# Patient Record
Sex: Female | Born: 1935 | Race: White | Hispanic: No | Smoking: Never smoker
Health system: Southern US, Community
[De-identification: ages and names within clinical notes are randomized; demographics above are authoritative.]

## PROBLEM LIST (undated history)

## (undated) DIAGNOSIS — K219 Gastro-esophageal reflux disease without esophagitis: Secondary | ICD-10-CM

## (undated) DIAGNOSIS — R269 Unspecified abnormalities of gait and mobility: Secondary | ICD-10-CM

## (undated) DIAGNOSIS — G4723 Circadian rhythm sleep disorder, irregular sleep wake type: Secondary | ICD-10-CM

## (undated) DIAGNOSIS — R42 Dizziness and giddiness: Secondary | ICD-10-CM

## (undated) DIAGNOSIS — G609 Hereditary and idiopathic neuropathy, unspecified: Secondary | ICD-10-CM

## (undated) DIAGNOSIS — E538 Deficiency of other specified B group vitamins: Secondary | ICD-10-CM

## (undated) DIAGNOSIS — E039 Hypothyroidism, unspecified: Secondary | ICD-10-CM

## (undated) DIAGNOSIS — F32A Depression, unspecified: Secondary | ICD-10-CM

## (undated) DIAGNOSIS — F518 Other sleep disorders not due to a substance or known physiological condition: Secondary | ICD-10-CM

## (undated) DIAGNOSIS — M810 Age-related osteoporosis without current pathological fracture: Secondary | ICD-10-CM

## (undated) DIAGNOSIS — M1990 Unspecified osteoarthritis, unspecified site: Secondary | ICD-10-CM

## (undated) DIAGNOSIS — F329 Major depressive disorder, single episode, unspecified: Secondary | ICD-10-CM

## (undated) HISTORY — PX: CHOLECYSTECTOMY: SHX55

## (undated) HISTORY — DX: Hypothyroidism, unspecified: E03.9

## (undated) HISTORY — DX: Dizziness and giddiness: R42

## (undated) HISTORY — DX: Hereditary and idiopathic neuropathy, unspecified: G60.9

## (undated) HISTORY — PX: OTHER SURGICAL HISTORY: SHX169

## (undated) HISTORY — DX: Deficiency of other specified B group vitamins: E53.8

## (undated) HISTORY — DX: Gastro-esophageal reflux disease without esophagitis: K21.9

## (undated) HISTORY — DX: Other sleep disorders not due to a substance or known physiological condition: F51.8

## (undated) HISTORY — DX: Unspecified abnormalities of gait and mobility: R26.9

## (undated) HISTORY — DX: Depression, unspecified: F32.A

## (undated) HISTORY — DX: Major depressive disorder, single episode, unspecified: F32.9

## (undated) HISTORY — PX: COSMETIC SURGERY: SHX468

## (undated) HISTORY — PX: ORIF NONUNION HUMERUS FRACTURE: SUR945

## (undated) HISTORY — DX: Circadian rhythm sleep disorder, irregular sleep wake type: G47.23

## (undated) HISTORY — DX: Unspecified osteoarthritis, unspecified site: M19.90

## (undated) HISTORY — DX: Age-related osteoporosis without current pathological fracture: M81.0

---

## 2000-04-18 ENCOUNTER — Encounter: Payer: Self-pay | Admitting: *Deleted

## 2000-04-18 ENCOUNTER — Emergency Department (HOSPITAL_COMMUNITY): Admission: EM | Admit: 2000-04-18 | Discharge: 2000-04-18 | Payer: Self-pay | Admitting: *Deleted

## 2000-04-20 ENCOUNTER — Inpatient Hospital Stay (HOSPITAL_COMMUNITY): Admission: EM | Admit: 2000-04-20 | Discharge: 2000-04-23 | Payer: Self-pay | Admitting: Internal Medicine

## 2000-04-20 ENCOUNTER — Encounter (INDEPENDENT_AMBULATORY_CARE_PROVIDER_SITE_OTHER): Payer: Self-pay | Admitting: Specialist

## 2000-04-21 ENCOUNTER — Encounter: Payer: Self-pay | Admitting: Gastroenterology

## 2000-04-22 ENCOUNTER — Encounter: Payer: Self-pay | Admitting: General Surgery

## 2003-05-19 ENCOUNTER — Other Ambulatory Visit: Admission: RE | Admit: 2003-05-19 | Discharge: 2003-05-19 | Payer: Self-pay | Admitting: Obstetrics and Gynecology

## 2003-08-09 ENCOUNTER — Ambulatory Visit (HOSPITAL_COMMUNITY): Admission: RE | Admit: 2003-08-09 | Discharge: 2003-08-09 | Payer: Self-pay | Admitting: Cardiology

## 2003-10-18 ENCOUNTER — Encounter: Admission: RE | Admit: 2003-10-18 | Discharge: 2003-10-18 | Payer: Self-pay | Admitting: Internal Medicine

## 2004-05-28 ENCOUNTER — Encounter: Admission: RE | Admit: 2004-05-28 | Discharge: 2004-05-28 | Payer: Self-pay | Admitting: Internal Medicine

## 2004-09-24 ENCOUNTER — Emergency Department (HOSPITAL_COMMUNITY): Admission: EM | Admit: 2004-09-24 | Discharge: 2004-09-24 | Payer: Self-pay | Admitting: Emergency Medicine

## 2004-10-07 ENCOUNTER — Ambulatory Visit (HOSPITAL_COMMUNITY): Admission: RE | Admit: 2004-10-07 | Discharge: 2004-10-07 | Payer: Self-pay | Admitting: Internal Medicine

## 2005-04-03 ENCOUNTER — Emergency Department (HOSPITAL_COMMUNITY): Admission: EM | Admit: 2005-04-03 | Discharge: 2005-04-04 | Payer: Self-pay | Admitting: Emergency Medicine

## 2005-04-09 ENCOUNTER — Encounter: Admission: RE | Admit: 2005-04-09 | Discharge: 2005-04-30 | Payer: Self-pay | Admitting: Internal Medicine

## 2005-05-23 ENCOUNTER — Encounter: Admission: RE | Admit: 2005-05-23 | Discharge: 2005-08-27 | Payer: Self-pay | Admitting: Internal Medicine

## 2006-12-01 HISTORY — PX: ALLOGRAFT APPLICATION: SHX6404

## 2007-06-29 ENCOUNTER — Emergency Department (HOSPITAL_COMMUNITY): Admission: EM | Admit: 2007-06-29 | Discharge: 2007-06-29 | Payer: Self-pay | Admitting: Emergency Medicine

## 2007-07-19 ENCOUNTER — Ambulatory Visit (HOSPITAL_COMMUNITY): Admission: RE | Admit: 2007-07-19 | Discharge: 2007-07-19 | Payer: Self-pay | Admitting: Internal Medicine

## 2007-07-22 ENCOUNTER — Ambulatory Visit (HOSPITAL_COMMUNITY): Admission: RE | Admit: 2007-07-22 | Discharge: 2007-07-22 | Payer: Self-pay | Admitting: Interventional Radiology

## 2007-08-05 ENCOUNTER — Encounter: Payer: Self-pay | Admitting: Interventional Radiology

## 2007-08-20 ENCOUNTER — Encounter: Payer: Self-pay | Admitting: Interventional Radiology

## 2007-08-23 ENCOUNTER — Encounter: Admission: RE | Admit: 2007-08-23 | Discharge: 2007-08-23 | Payer: Self-pay | Admitting: Interventional Radiology

## 2007-09-13 ENCOUNTER — Inpatient Hospital Stay (HOSPITAL_COMMUNITY): Admission: RE | Admit: 2007-09-13 | Discharge: 2007-09-16 | Payer: Self-pay | Admitting: Orthopedic Surgery

## 2007-09-14 ENCOUNTER — Ambulatory Visit: Payer: Self-pay | Admitting: Physical Medicine & Rehabilitation

## 2007-10-06 ENCOUNTER — Ambulatory Visit: Payer: Self-pay | Admitting: Cardiology

## 2007-10-06 ENCOUNTER — Inpatient Hospital Stay (HOSPITAL_COMMUNITY): Admission: EM | Admit: 2007-10-06 | Discharge: 2007-10-09 | Payer: Self-pay | Admitting: Emergency Medicine

## 2007-10-08 ENCOUNTER — Ambulatory Visit: Payer: Self-pay | Admitting: Vascular Surgery

## 2007-10-08 ENCOUNTER — Encounter (INDEPENDENT_AMBULATORY_CARE_PROVIDER_SITE_OTHER): Payer: Self-pay | Admitting: *Deleted

## 2007-10-26 ENCOUNTER — Ambulatory Visit (HOSPITAL_COMMUNITY): Admission: RE | Admit: 2007-10-26 | Discharge: 2007-10-26 | Payer: Self-pay | Admitting: Neurology

## 2008-08-11 ENCOUNTER — Encounter: Admission: RE | Admit: 2008-08-11 | Discharge: 2008-08-11 | Payer: Self-pay | Admitting: Neurology

## 2009-05-13 ENCOUNTER — Emergency Department (HOSPITAL_COMMUNITY): Admission: EM | Admit: 2009-05-13 | Discharge: 2009-05-13 | Payer: Self-pay | Admitting: Emergency Medicine

## 2009-12-07 ENCOUNTER — Encounter: Admission: RE | Admit: 2009-12-07 | Discharge: 2009-12-07 | Payer: Self-pay | Admitting: Internal Medicine

## 2009-12-17 ENCOUNTER — Emergency Department (HOSPITAL_COMMUNITY): Admission: EM | Admit: 2009-12-17 | Discharge: 2009-12-18 | Payer: Self-pay | Admitting: Emergency Medicine

## 2009-12-20 ENCOUNTER — Encounter: Admission: RE | Admit: 2009-12-20 | Discharge: 2009-12-20 | Payer: Self-pay | Admitting: Internal Medicine

## 2010-04-07 ENCOUNTER — Ambulatory Visit: Payer: Self-pay | Admitting: Diagnostic Radiology

## 2010-04-07 ENCOUNTER — Emergency Department (HOSPITAL_BASED_OUTPATIENT_CLINIC_OR_DEPARTMENT_OTHER): Admission: EM | Admit: 2010-04-07 | Discharge: 2010-04-07 | Payer: Self-pay | Admitting: Emergency Medicine

## 2010-12-22 ENCOUNTER — Encounter: Payer: Self-pay | Admitting: Internal Medicine

## 2011-02-16 LAB — POCT CARDIAC MARKERS
CKMB, poc: 1.6 ng/mL (ref 1.0–8.0)
Myoglobin, poc: 62.2 ng/mL (ref 12–200)

## 2011-02-16 LAB — DIFFERENTIAL
Basophils Absolute: 0 10*3/uL (ref 0.0–0.1)
Basophils Relative: 0 % (ref 0–1)
Lymphocytes Relative: 7 % — ABNORMAL LOW (ref 12–46)
Neutro Abs: 9.5 10*3/uL — ABNORMAL HIGH (ref 1.7–7.7)
Neutrophils Relative %: 89 % — ABNORMAL HIGH (ref 43–77)

## 2011-02-16 LAB — CBC
Hemoglobin: 14.7 g/dL (ref 12.0–15.0)
MCHC: 33.5 g/dL (ref 30.0–36.0)
MCV: 97.3 fL (ref 78.0–100.0)
RBC: 4.5 MIL/uL (ref 3.87–5.11)
RDW: 13.5 % (ref 11.5–15.5)

## 2011-02-16 LAB — URINE CULTURE

## 2011-02-16 LAB — URINALYSIS, ROUTINE W REFLEX MICROSCOPIC
Hgb urine dipstick: NEGATIVE
Ketones, ur: 40 mg/dL — AB
Leukocytes, UA: NEGATIVE
Nitrite: NEGATIVE
Urobilinogen, UA: 1 mg/dL (ref 0.0–1.0)

## 2011-02-16 LAB — SEDIMENTATION RATE: Sed Rate: 6 mm/hr (ref 0–22)

## 2011-02-16 LAB — URINE MICROSCOPIC-ADD ON

## 2011-02-18 LAB — CBC
Hemoglobin: 14.7 g/dL (ref 12.0–15.0)
MCHC: 34 g/dL (ref 30.0–36.0)
RBC: 4.61 MIL/uL (ref 3.87–5.11)
WBC: 8.9 10*3/uL (ref 4.0–10.5)

## 2011-02-18 LAB — URINALYSIS, ROUTINE W REFLEX MICROSCOPIC
Bilirubin Urine: NEGATIVE
Glucose, UA: NEGATIVE mg/dL
Hgb urine dipstick: NEGATIVE
Specific Gravity, Urine: 1.018 (ref 1.005–1.030)
pH: 6.5 (ref 5.0–8.0)

## 2011-02-18 LAB — URINE MICROSCOPIC-ADD ON

## 2011-02-18 LAB — BASIC METABOLIC PANEL
CO2: 28 mEq/L (ref 19–32)
Calcium: 9.7 mg/dL (ref 8.4–10.5)
Chloride: 104 mEq/L (ref 96–112)
Creatinine, Ser: 0.8 mg/dL (ref 0.4–1.2)
GFR calc Af Amer: >60 mL/min (ref >60)
Sodium: 143 mEq/L (ref 135–145)

## 2011-02-18 LAB — DIFFERENTIAL
Lymphs Abs: 0.9 10*3/uL (ref 0.7–4.0)
Monocytes Absolute: 0.6 10*3/uL (ref 0.1–1.0)
Monocytes Relative: 7 % (ref 3–12)
Neutro Abs: 7.3 10*3/uL (ref 1.7–7.7)
Neutrophils Relative %: 82 % — ABNORMAL HIGH (ref 43–77)

## 2011-02-18 LAB — POCT CARDIAC MARKERS: Troponin i, poc: <0.05 ng/mL (ref 0.00–0.09)

## 2011-04-15 NOTE — Discharge Summary (Signed)
NAMEAFNAN, CADIENTE NO.:  [id number]   MEDICAL RECORD NO.:  [id number]          PATIENT TYPE:  INP   LOCATION:  3023                         FACILITY:  [facility]   PHYSICIAN:  Doralee Albino. Carola Frost, M.D. DATE OF BIRTH:  [birth year]   DATE OF ADMISSION:  09/13/2007  DATE OF DISCHARGE:  09/15/2007                               DISCHARGE SUMMARY   DISCHARGE DIAGNOSIS:  Left proximal humerus nonunion.   ADDITIONAL DISCHARGE DIAGNOSES:  1. Hypothyroidism.  2. Gastroesophageal reflux disease.  3. Multiple falls.  4. Questioned arthritis.  5. Degenerative disk disease .  6. Facial injuries.  7. Prior left humerus fracture, which was sustained on May 31, 2007.   PROCEDURES PERFORMED:  September 13, 2007 -- repair of left humerus  nonunion.   BRIEF SUMMARY OF HOSPITAL COURSE:  Ellen Hughes underwent the procedure  described above without complication.  Postoperatively she did well with  pendulum range of motion of the shoulder, and active range of motion of  the elbow, wrist and hand with the assistance of occupational therapy.  She mobilized with physical therapy, given some vertigo and imbalance  issues.  She did require 2 units of packed red blood cells for acute  blood loss anemia.  She responded quite well to that and was able to  resume a regular diet and bowel function.  Her wound was noted to be  clean, dry and intact on postoperative day #3; and at that time she was  deemed stable for discharge.   DISCHARGE INSTRUCTIONS:  Ms. Latorre is to continue to mobilize with  physical therapy, with help given her history of imbalance and vertigo.  She may [place] a cane on the contralateral extremity for assistance with  balance.  She will continue to work with occupational therapy, or  occupational therapy is not available, for active-passive range of  motion of the shoulder, wrist and hand; as well as pivotal motions of  the shoulder.  She will be in a sling for comfort.  Dressings  should be  changed daily until there is no drainage.  After there has been no  drainage for 48 hours she may shower.   I will plan to follow up with her in 2 weeks, at which time will remove  her staples and likely initiate more aggressive-passive range of motion;  with active range of motion of the shoulder beginning around 6-8 weeks.   DISCHARGE MEDICATIONS:  1. Percocet 5/325 mg 1-2 tablets q.4-6 h. as needed for pain control.  2. She may resume a myriad of over-the-counter medications, these      include:  fish oil daily, lutein 20 mg q.a.m., biotin 1000 mcg      q.a.m., folic acid 800 mcg daily, platinum C daily, Miralax daily,      OsCal.  3. Mobic should be held.  She can take:  1. Lexapro 20 mg p.o. daily,  2. Synthroid 100 mcg p.o. daily.  3. Prilosec 20 mg p.o. daily.  4. Additional over-the-counter stool softeners as needed.  5. Robaxin 500 mg p.o. q.6 h. p.r.n.  6. Ambien as needed daily.  7. Phenergan for nausea, which she has used in the past.      Doralee Albino. Carola Frost, M.D.  Electronically Signed     MHH/MEDQ  D:  09/15/2007  T:  09/16/2007  Job:  [id number]

## 2011-04-15 NOTE — Op Note (Signed)
NAMEAYSE, MCCARTIN NO.:  [id number]   MEDICAL RECORD NO.:  [id number]          PATIENT TYPE:  INP   LOCATION:  3023                         FACILITY:  [facility]   PHYSICIAN:  Doralee Albino. Carola Frost, M.D. DATE OF BIRTH:  1921-02-15   DATE OF PROCEDURE:  09/13/2007  DATE OF DISCHARGE:  09/16/2007                               OPERATIVE REPORT   PREOPERATIVE DIAGNOSIS:  Left proximal humerus pseudoarthrosis.   POSTOPERATIVE DIAGNOSIS:  Left proximal humerus pseudoarthrosis.   PROCEDURE:  Repair of left humerus nonunion using plating and OP1  allograft.   SURGEON:  Myrene Galas, M.D.   ASSISTANT:  R.N.   ANESTHESIA:  General.   COMPLICATIONS:  None.   SPECIMENS:  None.   DRAINS:  None.   DISPOSITION:  PACU, condition stable.   BRIEF SUMMARY OF INDICATIONS FOR PROCEDURE:  Skarlette Lattner is an 77-year-  old right-hand dominant female who has had a left comminuted proximal  humerus and humeral shaft fracture treated non operatively by Dr.  Jerl Santos for the last few months.  These have gone on to develop a  pseudoarthrosis at the distal site with what appears to be union  proximally.  We discussed the risks and benefits of surgery including  the possibility of infection, nerve injury, vessel injury, failure of  union, hardware failure, decreased range of motion, radial nerve injury,  specifically as well as others including heart attack, stroke and PE,  DVT, after that discussion, the patient and family wish to proceed.   DESCRIPTION OF PROCEDURE:  Ms. Gulledge was taken to the operating room  where general anesthesia was induced.  She was positioned supine on a  radiolucent table.  The left upper extremity prepped, draped usual  sterile fashion.  No tourniquet was used.  A standard anterolateral  approach to the humeral shaft was made extending proximally toward the  deltopectoral interval.  We retracted the biceps medially identified the  brachia split in line with  the fracture site and passed into copious  clear synovial type fluid consistent with pseudoarthrosis.  This area  was drained, the fracture identified by meticulous dissection.  We were  very careful watch for the radial nerve.  Again there was a gapped  malunion/union of the proximal fracture that extended into the surgical  neck and was continuous with a long lateral spike.  Using this spike, we  corrected for appropriate rotation and length and then placed two lag  screws.  Later one of those lag screws would be inadvertently driven  further into the bone by a locked screw placed through the plate near it  but these screws initially held the reduction while we checked  provisional orthogonal C-arm images confirming alignment and placement.  We then fashioned a long 35 locked plate along the tension side of the  bone and passed standard screws first to obtain compression and adequate  span of the bone and then this was followed by a series of locked  screws, one of which again passed close to the lag screw and driving it  through the bone.  We were unable to withdraw it without removing all  the screws and consequently, we used the bone cutter to clip the end  from the of the side which had already been exposed in the course of  protecting the radial nerve.  We then irrigated thoroughly.  We then  took 10 mL of Vitoss pack mixed with OP1 and were able to tamp this  securely into the fracture site resulting in nice fill of the fracture  site/nonunion, pseudoarthrosis site.  We then performed a careful  layered closure with 0 Vicryl, 2-0 Vicryl and staples.  Sterile gently  compressive dressing was applied.  The patient was placed into a splint  and sling, awakened from anesthesia and transported to the PACU in  stable condition.   PROGNOSIS:  Ms. Odle should do well following repair of her left humerus  if she can go on to unite.  We were able to get an excellent fill of the  nonunion  site and remain hopeful this will translate into healing for  her, given the new environment of biomechanical stability.  Given her  medical problems and age, she remains at increased risk for  perioperative complications.      Doralee Albino. Carola Frost, M.D.  Electronically Signed     MHH/MEDQ  D:  09/27/2007  T:  09/28/2007  Job:  [id number]

## 2011-04-15 NOTE — Consult Note (Signed)
Ellen Hughes, Ellen Hughes                 ACCOUNT NO.:  [id number]   MEDICAL RECORD NO.:  [id number]          PATIENT TYPE:  INP   LOCATION:  6740                         FACILITY:  [facility]   PHYSICIAN:  Kinnie Scales. Annalee Genta, M.D.DATE OF BIRTH:  [birth year]   DATE OF CONSULTATION:  10/06/2007  DATE OF DISCHARGE:                                 CONSULTATION   ENT/FACIAL TRAUMA CONSULT:   REFERRING SERVICE:  [facility].   REASON FOR CONSULTATION:  Ellen Hughes is an 77 year old white female who  presents the [facility] Emergency Department after suffering a  fall.  She has a history of syncope and unsteadiness and has had  multiple falls in the past with orthopedic injuries.  She was in a rehab  facility at the [facility], when she fell in an witnessed  injury.  She had loss of consciousness and possible head trauma.  She  was brought by EMS in the company of her daughter to the [facility]  Emergency Department for further evaluation and workup.  The patient  complains of right-sided facial swelling and pain with significant  periorbital ecchymosis.  A CT scan was obtained in the [facility] Emergency Department, which showed significant right  periorbital trauma.  The patient had nondisplaced right zygomatic  fracture and a medial inferior orbital wall blowout fracture with  minimal displacement.  There is soft tissue in the maxillary sinus  consistent with blood and air in the right periorbital region.  The  patient complains of moderate discomfort and swelling.  She has normal  vision.  She has a history of prior bilateral cataract surgery and no  change in vision, no significant diplopia and no active bleeding or  laceration.  The patient has a recent history of nasal trauma, but is  otherwise stable.   EXAMINATION:  GENERAL:  Ellen Hughes is an elderly white female examined in  the emergency department in the company of her daughter.  She is alert  and  oriented to self, place and time and is in no acute distress.  HEAD AND NECK:  Ears:  Show normal external auditory canals and tympanic  membranes.  TMs are intact, mobile and no effusion or erythema.  Nasal  cavity is patent with a deviated septum and minimal discharge.  Oral  cavity/oropharynx:  Normal oral dentition.  No laceration.  No evidence  of fracture.  The patient has undergone prior maxillary and mandibular  dental implants.  There is some postnasal discharge and ecchymosis in  the right buccal region.  Neck:  No lymphadenopathy or mass.  Normal  thyroid gland to palpation.  Nontender in the neck.  The patient has a  significant amount of right periorbital ecchymosis and swelling.  Extraocular mobility is intact with no significant diplopia.  The  patient reports normal vision.  She has upper and lower eyelid  ecchymosis, but no palpable orbital fracture and no evidence of  additional facial trauma and no laceration.   IMPRESSION:  1. Right orbital trauma.  2. Right nondisplaced zygomatic fracture.  3.  Right orbital blowout fracture.   ASSESSMENT AND PLAN:  Given Ellen Hughes history, examination and physical  findings, I discussed various approaches and treatment options with the  patient and her daughter.  Luckily, her facial injuries are nondisplaced  and she may not require any surgical intervention.  We will monitor her  for additional change in symptoms.  I plan is to submit her to the  hospital under the Trauma Service and I would recommend outpatient  followup in my office in 7-10 days for recheck and reevaluation.  Based  on findings at that time, the patient may require orbital  reconstruction, but will probably be stable without any further  surgeries.  The patient's family will call for a followup appointment in  my office at [facility].   ADDITIONAL RECOMMENDATIONS:  Elevating the head of the bed by 30  degrees, no nose blowing, open mouth sneezing,  frequent nasal saline  spray 4 puffs per nostril 4 times per day.  The patient will use an ice  compress over the right periorbital region.  I recommended antibiotics,  Augmentin 500 mg p.o. b.i.d. for 10 days, and pain medication as needed.  After discharge from the hospital the patient will return to [facility]  for continued rehab and further monitored nursing care.           ______________________________  Kinnie Scales. Annalee Genta, M.D.     DLS/MEDQ  D:  54/08/8118  T:  10/07/2007  Job:  [id number]

## 2011-04-15 NOTE — Discharge Summary (Signed)
Ellen Hughes, Ellen Hughes NO.:  [id number]   MEDICAL RECORD NO.:  [id number]          PATIENT TYPE:  INP   LOCATION:  6740                         FACILITY:  [facility]   PHYSICIAN:  Hettie Holstein, D.O.    DATE OF BIRTH:  Jul 21, 1921   DATE OF ADMISSION:  10/06/2007  DATE OF DISCHARGE:  10/09/2007                               DISCHARGE SUMMARY   PRIMARY CARE PHYSICIAN:  Dr. Murray Hodgkins at [facility].   FINAL DIAGNOSES:  1. Recurrent falls with traumatic facial fracture.  2. Peripheral neuropathy with bilateral foot drop.  3. Question of syncope with no arrhythmias during this hospitalization      on telemetry, with a normal 2-D echocardiogram and the family was      quite adamant that they do not believe this was related to her      heart and actually wanted her to return to [facility] upon initial      arrival.  4. Pneumonia, to conclude treatment on October 15, 2007.  5. Bilateral foot drop that is felt to be a large contributor to her      recurrent falls.  She is being fitted with posterior foot drop      splints for use in a skilled facility.  Until then, it is      recommended that she only ambulate with a wheelchair until then, as      her risks for falls is increased.   OTHER DIAGNOSES:  Please refer to the H&P as dictated by Dr. Michaelyn Barter.   CONSULTANTS THIS ADMISSION:  Dr. Annalee Genta, who had evaluated her for a  right nondisplaced zygomatic fracture and a right orbital blowout  fracture and a right orbital trauma, who provided followup information  for her to see him in his office within 7-10 days and they are to  contact Dr. Thurmon Fair office at [phone].   STUDIES PERFORMED THIS ADMISSION:  She underwent a 2-D echocardiogram  with essentially normal EF.   She underwent an MRI of the brain that revealed no evidence for acute  infarct.  MRI of the brain revealed no acute intracranial abnormality.  There was some very advanced  chronic small vessel disease that was noted  to have progressed since 2005 and there was some mild stenosis of the  left MCA origin suggesting atherosclerosis.   CT images of her head were as described above.   For full details, please refer to the consultation by Dr. Annalee Genta.   MEDICATIONS ON DISCHARGE:  She is going to resume her:  1. Lexapro 20 mg daily.  2. Levothyroxine 100 mcg daily as before.  3. She can continue her supplements including lutein, folic acid as      well as fish oil and zinc.  4. She can resume Colace 100 mg daily.  5. Tylenol Extra-Strength 500 mg two tabs t.i.d.  6. Robaxin 500 mg p.o. q.6 h. p.r.n.  7. Prilosec 20 mg daily.  8. Calcium 600/400 one tab twice daily.  9. B-12 injections 1000 mcg monthly.  10.Senokot one p.o. p.r.n. constipation  daily.  11.Vitamin C two tabs p.o. q.a.m.  12.Percocet 5/325 one to two tablets every 4-6 hours p.r.n. pain.  13.She can resume __________ provided by her family.  14.Avelox 400 mg daily until October 15, 2007.   DISPOSITION AT PRESENT:  [name] is felt to be medically stable for  transition back to the skilled nursing facility of [facility] and  instructed not to ambulate with the exception of in a wheelchair until  she is fitted with appropriately for posterior footdrop splints  bilaterally.  It was noted that these boots are fitted by [facility],  phone number [phone].  Additionally, she should follow up with Dr.  Annalee Genta as described above for followup on her facial fractures and  [facility] to follow up, as her son requested referral for a  neurology visit.  In addition, she had an EEG for which the results are  still pending over the weekend; this can be followed up by Dr. Murray Hodgkins in the outpatient setting, once this is eventually interpreted  after the weekend.   HISTORY OF PRESENTING ILLNESS:  For full details, please refer to the  H&P as dictated by Dr. Michaelyn Barter; however,  briefly, [name] is  an 77 year old female who resides at [facility], who had been having  recurrent falls.  She had numerous falls with recurrent injuries and  most recently fell and sustained some facial injury where she had  sustained a blowout fracture and a nondisplaced zygomatic fracture; this  was addressed by Dr. Annalee Genta in the emergency department.  Details  surrounding her episode are not absolutely clear.  It is undetermined  whether [name] lost consciousness, though she recalls very few  details of the event and in any event, she simply recalls waking up on  the floor; there was no antecedent symptoms described, however.   HOSPITAL COURSE:  [name] was admitted and initially underwent  evaluation by ENT as described above.  She was monitored on telemetry.  Cardiac markers were cycled and did not reveal acute ischemic event.  She underwent a 2-D echocardiogram revealing normal ejection fraction.  MRA of the brain revealed no acute infarct and her carotid and Dopplers  revealed no evidence of stenosis.  An  astute evaluation by our physical  therapy revealed that she had bilateral footdrop and noticed that this  was a likely contributing factor for her recurrent falls and it is  recommended at this time that she be fitted with foot drop splints and  these need to be fitted, once she returns to the skilled nursing  facility setting and she needs to undergo further physical therapy  evaluation and treatment in the outpatient setting.  Her EEG is pending  at the time of discharge.  A no-code-blue status was affirmed during  this hospitalization.  She is felt to be stable for discharge with  respect to her recurrent falls.  She is asked to mobilize with a  wheelchair only until she can be fitted appropriately with posterior  footdrop splints.      Hettie Holstein, D.O.  Electronically Signed     ESS/MEDQ  D:  10/09/2007  T:  10/09/2007  Job:  [id number]   cc:   Lenon Curt. Chilton Si, M.D.  Juline Patch, M.D.  Kinnie Scales. Annalee Genta, M.D.

## 2011-04-15 NOTE — H&P (Signed)
NAMEJAQLYN, Ellen Hughes NO.:  [id number]   MEDICAL RECORD NO.:  [id number]          PATIENT TYPE:  INP   LOCATION:  6740                         FACILITY:  [facility]   PHYSICIAN:  Michaelyn Barter, M.D. DATE OF BIRTH:  Oct 12, 1921   DATE OF ADMISSION:  10/06/2007  DATE OF DISCHARGE:                              HISTORY & PHYSICAL   PRIMARY CARE PHYSICIAN:  Juline Patch, M.D.   CHIEF COMPLAINT:  I fall a lot.   HISTORY OF PRESENT ILLNESS:  Ellen Hughes is an 77 year old female who is  currently accompanied by her daughter, Ellen Hughes.  The patient's  daughter gives the history.  She states that the patient is a resident  of the Rehab [facility] of [address].  She indicates the patient has had  numerous recent falls during which time she has injured herself.  On June 29, 2007, the patient fell.  She broke her nose as well as her  left arm.  Approximately one week later, the patient fell again and this  time she injured her back requiring surgery that included cementing the  region that was fractured.  She later needed a spinal epidural secondary  to ongoing back pain.  Approximately 3 weeks ago, the patient had to  have another surgical procedure completed on her left arm secondary to  improper healing.  Dr. Carola Frost placed a plate plus 12 screws into the  patient's left arm.  This was done secondary to left proximal humerus  pseudoarthrosis at which time Dr. Carola Frost performed a repair of the left  humerus nonunion using plating and an OP1 allograft.  The patient's daughter has indicated since the patient's surgery  approximately 3 weeks ago, her condition overall has continued to  decline.  She states that initially there was some concern that the  patient may have had a CVA, following her last surgery.  She states that  the patient has gotten to the point whereby she almost needs total  assistance with completing her ADLs.  Today just before lunch, the  patient got up  out of the chair and subsequently was later found on the  floor passed out.  Neither the patient nor the staff knows what happened  to the patient.  The patient indicates that she cannot recall any of the  events that lead up to her being on the floor.  She also does not know  whether she passed out for certain.  However, the daughter indicates  that she believes that her mother may have passed out.  There was no complaint of nausea, vomiting, fever or chills, no chest  pain or shortness of breath.  The daughter indicates that the patient  was not down very long.  There was no seizure like activity noted.  No  urine or bowel incontinence.   PAST MEDICAL HISTORY:  1. Hypothyroidism.  2. GERD.  3. Multiple falls in the past resulting in a nose fracture, as well as      a left arm fracture, and also injury to the patient's back.  4. Question arthritis.  5.  Degenerative disk disease.  6. History of facial injuries.  7. History of cholecystitis with cholelithiasis.   PAST SURGICAL HISTORY:  1. Bilateral cataract surgery.  2. Face lift and blepharoplasty.  3. Abdominoplasty.  4. Laparoscopic cholecystectomy and intraoperative cholangiogram      completed by Dr. Claud Kelp in May 2001.  5. Repair of left humerus nonunion using plating and OP1 allograft      completed on September 13, 2007, by Dr. Myrene Galas.   ALLERGIES:  No known drug allergies.   CURRENT MEDICATIONS:  Antivert, Biotin, calcium, Colace, fish oil, folic  acid, Lexapro, lutein, Prilosec, Senokot, Synthroid, vitamin B1, zinc  sulfate.   SOCIAL HISTORY:  Cigarettes:  The patient stopped smoking 10 years ago.  Alcohol:  The patient stopped drinking several months ago.  She only  occasionally drank prior to that.   FAMILY HISTORY:  Mother had diabetes mellitus, died from pancreatic  cancer.  Father's medical history is unknown.   REVIEW OF SYSTEMS:  As per HPI.   PHYSICAL EXAMINATION:  GENERAL:  The patient is  awake.  She is  cooperative.  She is in no obvious respiratory distress.  VITAL SIGNS:  Her temperature is 97, blood pressure 153/77, heart rate  91, respirations 20, O2 sat 94% on room air.  HEENT:  The patient has a large right periorbital contusion.  It is very  difficult for her to open her upper and lower eyelids secondary to the  significant amount of swelling.  Likewise, there is a significant amount  of swelling that goes from the patient's forehead all the way down to  the right side of her chin.  Extraocular movements are intact.  There is  positive pupil constriction to light within the left eye.  Oral mucosa  is pink.  No thrush.  No exudate.  NECK:  Supple.  No JVD.  No lymphadenopathy.  No thyromegaly.  CARDIAC:  S1 S2 present.  Regular rate and rhythm.  RESPIRATORY:  No crackles or wheezes.  ABDOMEN:  Flat, soft, nontender, nondistended.  Positive bowel sounds x4  quadrants.  No masses palpated.  EXTREMITIES:  No leg edema.  The left arm is in a sling currently.  NEUROLOGIC:  The patient is alert and oriented x3.  Cranial nerves II-  XII are intact excluding the gag reflex which was not tested.  There is  no facial droop.  Hand grip strength is equal bilaterally.  Left leg  strength is approximately 5/5.  Right leg strength is approximately 3.5  out of 5.   LABORATORY:  D-dimer is 2.02.  White blood cell count is 10.7,  hemoglobin 12.9, hematocrit 38.7, platelets 411.  Urinalysis:  Nitrites  and leukocytes are both negative.  Cardiac markers:  CK-MB, point-of-  care, less than 1.  Troponin I, point-of-care, less than 0.05.  PH 7.42,  pCO2 43.2, bicarbonate 28.  Hemoglobin 14.3, hematocrit 42.  Sodium 136,  potassium 4, chloride 103, glucose 112, BUN 12, creatinine 0.7.  EKG  reveals a normal sinus rhythm and right bundle branch block.  CT of the  chest with angio contrast reveals no evidence for acute pulmonary  embolus.  Cluster of soft tissue nodules in the left upper  lobe, these  are probably related to infection or inflammation.  It is recommended  that the patient have a followup CT within 3 months to exclude  progression.  CT of the head without contrast revealed no evidence of  bleed or intracranial process,  atrophy, and nonspecific white matter  changes.  CT of the orbit revealed a right facial tripod fracture and  right blowout fracture without evidence of entrapment.  CT of the  cervical spine without contrast reveals no evidence of fracture,  multilevel degenerative changes, reversal of the normal lordosis which  may be due to positioning, spasm, or soft tissue injury, a 3-mm  anterolisthesis C2, C3 which may be related to advanced facet disease,  although flexion extension radiographs will be required to exclude  dynamic instability.   ASSESSMENT/PLAN:  1. Probable syncopal episode.  The etiology of this is questionable.      From the patient's story which is very vague, it is hard to tell      whether or not the patient actually passed out.  Likewise it is      difficult to determine what led up to the event.  There were no      witnesses to the event.  Head CT has been negative for any acute      events.  The patient has not complained of any cardiovascular      related symptoms, no chest pain, no shortness of breath.  Her EKG      reveals a normal sinus rhythm with a right bundle branch block.      Therefore, I suspect that the etiology of the patient's current      probable syncopal event may be noncardiac in origin.  However, we      will rule the patient out for a cardiac event by monitoring her      cardiac enzymes.  We will order a 2D echocardiogram, and we will      implement fall precautions for now.  We will also consult physical      therapy to have the patient evaluated.  It appears that the patient      has had numerous recent falls.  During each fall she appears to      have caused a significant amount of injury to herself.   This issue      is going to need to be further investigated in order to prevent      further future injury.  2. Right facial trauma.  ENT has already seen the patient.  She will      follow up with them as an outpatient.  3. Recent left arm surgery associated with a remote history of      falling.  We will monitor this for now.  4. History of hypothyroidism.  We will check a TSH, T4.  5. History of gastroesophageal reflux disease.  We will provide      Protonix.  6. Deep vein thrombosis prophylaxis.  We will provide Lovenox.  7. Code status:  DO NOT RESUSCITATE.      Michaelyn Barter, M.D.  Electronically Signed     OR/MEDQ  D:  10/06/2007  T:  10/06/2007  Job:  [id number]   cc:   Juline Patch, M.D.

## 2011-04-15 NOTE — Procedures (Signed)
EEG NUMBER:  [id number].   REQUESTING PHYSICIAN:  Hettie Holstein, D.O.   ATTENDING PHYSICIAN:  [facility].   CURRENT HISTORY:  An 77 year old woman with a syncopal event.  EEG is  performed for evaluation.   DESCRIPTION:  The dominant waking rhythm of this tracing is a moderate-  amplitude alpha rhythm of 9-10 Hz which predominates posteriorly,  appears without abnormal asymmetry, and attenuates with eye opening and  closing.  Low-amplitude fast activity is seen frontally and centrally.  Most of the recording is made in drowsiness and sleep, as evidenced by  fragmentation of the background and general attenuation of rhythms.  In  sleep, a diffuse theta-range rhythm of 6-8 Hz predominates, with  occasional appearance of central dominant sleep spindles.  No  abnormalities are seen in drowsiness or sleep.  Photic stimulation  produced a driving responses better seen on the left than on the right.  Hyperventilation was not performed.  A single channel devoted to EKG  revealed sinus rhythm throughout with a rate of approximately 78 beats  per minute.   CONCLUSIONS:  Normal study in awake, drowsy and light sleep states.      Michael L. Thad Ranger, M.D.  Electronically Signed     [id number]  D:  10/08/2007 16:37:40  T:  10/09/2007 14:57:54  Job #:  [id number]

## 2011-04-15 NOTE — Consult Note (Signed)
NAMEHURLEY, BLEVINS NO.:  [id number]   MEDICAL RECORD NO.:  [id number]          PATIENT TYPE:  OUT   LOCATION:  XRAY                         FACILITY:  [facility]   PHYSICIAN:  Delton See, P.A.   DATE OF BIRTH:  21-May-1921   DATE OF CONSULTATION:  08/05/2007  DATE OF DISCHARGE:                                 CONSULTATION   CHIEF COMPLAINT:  Status post T12 vertebroplasty performed July 22, 2007.   CHIEF COMPLAINT:  Status post T12 vertebroplasty performed July 22, 2007.   HISTORY OF PRESENT ILLNESS:  This is a very pleasant 77 year old female  referred to Dr. Corliss Skains through the courtesy of Dr. Jerl Santos and Dr.  Frederik Pear.  The patient has a history of multiple falls.  She is  currently at the [facility].  She recently had  several falls at which time she suffered a T12 compression fracture as  well as a left humerus fracture and some facial injuries.  Dr. Jerl Santos  has been caring for the patient's humerus fracture.  She was referred to  Dr. Corliss Skains for the T12 compression fracture and on July 22, 2007,  underwent a vertebroplasty.  The patient returns today accompanied by  her daughter to be seen in follow-up.   PAST MEDICAL HISTORY:  1. Hypothyroidism.  2. Gastroesophageal reflux disease.  3. Remote history of tobacco.  4. Multiple falls.  5. Depression.  6. She had a heart catheterization 4 years ago.  We do not have the      results of the study.  7. History of arthritis.  8. Degenerative disk disease by her MRI.  9. Facial injuries and a left humerus fracture from a fall on June 30, 2007.   SURGICAL HISTORY:  1. Significant for a face lift.  2. Tubal ligation.  3. Laparoscopic cholecystectomy.  4. Abdominoplasty.  5. Blepharoplasty as well as cataract surgery.  6. She reports being sensitive to anesthesia.   ALLERGIES:  INTOLERANT TO CODEINE.   CURRENT MEDICATIONS:  1. Oxycodone which she tells [place]  she takes about every 4 hours for      pain.  2. Mobic 7.5 mg daily.  3. Lexapro daily.  4. Zinc daily.  5. Fish oil daily.  6. Lutein 20 mg each morning.  7. Biotin 1000 mcg each morning.  8. Folic acid 800 mcg daily.  9. Vitamin C daily.  10.MiraLax daily.  11.Os-Cal with vitamin D daily.  12.Synthroid daily.  13.Senokot daily.  14.Tylenol p.r.n.  15.Colace p.r.n.  16.Ambien p.r.n.  17.Omeprazole 20 mg p.r.n.  18.Phenergan p.r.n.   IMPRESSION/PLAN:  As noted, the patient returns today to be seen in  follow-up by Dr. Corliss Skains after undergoing a T12 vertebroplasty on  July 22, 2007.  She is accompanied by her daughter.  The patient  reports that her pain in her back is significantly improved, however,  she continues to have low back, hip and groin pain.  She has been  participating in physical and occupational therapy, but this seems to be  very tiring for her.   Dr. Corliss Skains had a long discussion with the patient and her daughter.  He reviewed the images from her recent vertebroplasty.  He was not  certain as to the etiology of her groin and hip pain, although, she  reports this as being new.  He felt that this might be in part due to  her degenerative disk disease or possibly a new compression fracture.  He felt that she should give these symptoms another week or two and see  if they do not resolve on their own in combination with her physical and  occupational therapy as she appears to be very deconditioned.  We did  schedule her for follow-up visit in 3 weeks to see if her symptoms have  improved, if she felt strongly about it, he would repeat an MRI at that  time.  He also felt that possibly Dr. Jerl Santos could address some of the  issues with her groin and hip pain.  She is due to see Dr. Jerl Santos next  week   Dr. Corliss Skains also cautioned her that oxycodone could make her very  constipated and more prone to falling.  He recommended that she take the  oxycodone  only when she absolutely needed it and to continue on the  Mobic and PR Tylenol to help control her pain symptoms.   Greater than 15 minutes was spent on this consult.      Delton See, P.A.     DR/MEDQ  D:  08/05/2007  T:  08/06/2007  Job:  [id number]   cc:   Lubertha Basque. Jerl Santos, M.D.  Lenon Curt Chilton Si, M.D.  Juline Patch, M.D.

## 2011-04-15 NOTE — Consult Note (Signed)
Ellen Hughes, Ellen Hughes NO.:  [id number]   MEDICAL RECORD NO.:  [id number]          PATIENT TYPE:  OUT   LOCATION:  XRAY                         FACILITY:  [facility]   PHYSICIAN:  Sanjeev K. Deveshwar, M.D.DATE OF BIRTH:  Sep 22, 1921   DATE OF CONSULTATION:  07/20/2007  DATE OF DISCHARGE:                                 CONSULTATION   CHIEF COMPLAINT:  Status post T12 vertebroplasty performed July 22, 2007.   HISTORY OF PRESENT ILLNESS:  This is a very pleasant 77 year old female  referred to Dr. Corliss Skains through the courtesy of Dr. Jerl Santos and Dr.  Murray Hodgkins.  The patient has a history of multiple falls.  She lives  at [facility].  She fell in August and developed  a T12 compression fracture, as well as a left humerus fracture and some  facial fractures.  Dr. Jerl Santos has been caring for the patient's  humerus fracture.  She saw Dr. Corliss Skains in consultation for her T12  fracture, and on July 22, 2007 had a vertebroplasty.  She returned to  the office to be seen in follow-up on August 05, 2007, at which time  she was still having low back pain, although she was not having pain at  the site of the vertebroplasty.  The pain appeared to be lower.  Her MRI  did show degenerative disk disease, as well as some spinal stenosis.  Dr. Corliss Skains felt that she might benefit from epidural injections or  possibly a repeat MRI at some point in the future, to rule out another  compression fracture.  The patient has since followed up with Dr.  Jerl Santos, who apparently agrees with the plan for epidural injections.  The patient returns today to be seen in followup by Dr. Corliss Skains.   PAST MEDICAL HISTORY:  Significant for hypothyroidism, gastroesophageal  reflux disease, remote history of tobacco, multiple falls, depression,  heart catheterization 4 years ago, history of arthritis, degenerative  disk disease by MRI with spinal stenosis, facial  injuries and left  humerus fracture as a result of a fall on June 30, 2007.   SURGICAL HISTORY:  Significant for a face lift, tubal ligation,  laparoscopic cholecystectomy, abdominal surgery, blepharoplasty.  The  patient reports she is sensitive to anesthesia.   ALLERGIES:  THE PATIENT IS INTOLERANT TO CODEINE.   MEDICATIONS:  Please see complete list in the last office visit note.  She is still taking Mobic, as well as p.r.n. oxycodone for pain.   IMPRESSION AND PLAN:  As noted, the patient returns today to be seen  again in follow-up by Dr. Corliss Skains after her T12 vertebroplasty on  July 22, 2007.  She is now approximately 4 weeks out from her  procedure.  She continues to have low back pain, as well as hip pain.  She recently saw Dr. Jerl Santos.  The patient tells [place] she had hip x-rays,  which apparently were negative.  The patient is accompanied by one of  her daughters today.  She reports that the patient is having difficulty  with physical therapy.  She requires  is a great deal of assistance for  ambulation and spends most of her time in a wheelchair.  The patient  continues to have back and hip pain.  Dr. Jerl Santos has recommended  epidural injections.  Dr. Corliss Skains agrees with this plan.  Her back and  hip pain are most likely due  to her spinal stenosis and degenerative disk disease of the lumbar  spine.  We have scheduled her for the epidural injections.  Followup  will be on a p.r.n. basis.   Greater than 10 minutes was spent on this consult.      Delton See, P.A.    ______________________________  Grandville Silos. Corliss Skains, M.D.    DR/MEDQ  D:  08/20/2007  T:  08/21/2007  Job:  [id number]   cc:   Lenon Curt. Chilton Si, M.D.  Lubertha Basque Jerl Santos, M.D.

## 2011-04-18 NOTE — Discharge Summary (Signed)
[facility]  Patient:    Ellen Hughes, Ellen Hughes                          MRN: [id number] Adm. Date:  [id number] Disc. Date: [id number] Attending:  Brandy Hale CC:         Petra Kuba, M.D.                           Discharge Summary  FINAL DIAGNOSES: 1. Chronic cholecystitis with cholelithiasis. 2. Probable choledocholithiasis. 3. History of hypothyroidism. 4. Status post abdominoplasty. 5. Status post face lift and blepharoplasty. 6. Multiparity. 7. Status post bilateral cataract surgery.  OPERATIONS: 1. Endoscopic retrograde cholangiopancreatography with sphincterotomy on    Apr 21, 2000. 2. Laparoscopic cholecystectomy with intraoperative cholangiogram on Apr 22, 2000.  HISTORY OF PRESENT ILLNESS:  This is a 77 year old white female who developed epigastric pain, right upper quadrant pain, back pain, and repeated nausea and vomiting on Saturday, Apr 18, 2000.  She was constipated and had no had any similar episodes.  She came to the [facility] Emergency Room and was evaluated by the emergency department physician.  A CT scan was obtained, which was normal, except for a "enlarged uterus."  Ultrasound showed gallstones and a 5.5 mm common bile duct stone.  It really did look like she had a filling defect with acoustic shadowing in the common bile duct.  She was sent home from the emergency room and felt fine until Apr 20, 2000.  On Apr 20, 2000, she developed recurrent epigastric pain after breakfast with nausea and vomiting and came back to see [place] in the emergency room, requesting definitive management.  For details of her past history, social history, and family history, please see the detailed admission note.  PHYSICAL EXAMINATION:  An overweight white female in moderate distress.  She received some Demerol in the ER and felt better.  VITAL SIGNS:  Temperature 97.6 degrees, pulse 97, respiratory rate 16,  blood pressure 184/86.  HEENT:  Sclerae clear.  Positive dentures.  NECK:  Supple.  No mass.  No bruit.  LUNGS:  Clear.  HEART:  Regular rate and rhythm.  No murmur.  ABDOMEN:  Soft.  Tender in the epigastrium and right upper quadrant.  No guarding.  No mass.  No rebound.  Abdominoplasty scar and lower midline scar are noted.  EXTREMITIES:  No edema.  Good pulses.  ADMISSION LABORATORY DATA:  Cardiac enzymes negative.  Urinalysis normal. SGOT 45.  Other liver function tests normal.  Serum amylase 32.  White blood cell count 10,400, hemoglobin 14.7.  HOSPITAL COURSE:  The patient was admitted and placed on IV antibiotics for presumed cholecystitis and possible choledocholithiasis.  Petra Kuba, M.D., was asked to see the patient because of the abnormal ultrasound findings suggesting common bile duct stones.  He performed ERCP on Apr 21, 2000.  No obvious stone was seen.  A small sphincterotomy was performed.  There was a little bit of bleeding around the ampulla after the sphincterotomy and that was controlled with epinephrine injection.  The patient had some nausea that afternoon, but felt fairly good the following morning.  On Apr 22, 2000, her liver function tests were normal.  She was taken to the operating room on that day and underwent laparoscopic cholecystectomy with intraoperative cholangiogram.  Her gallbladder was acutely inflamed.  She did  have gallstones and the cholangiogram was normal.  Postoperatively, the patient did very well.  She remained comfortable, had no nausea or vomiting, and resumed diet and activities without much difficulty. On the morning of Apr 23, 2000, she was doing well, the abdomen was soft, the wounds were healing uneventfully, she was tolerating a diet, and felt well enough to go home.  She was discharged on Apr 23, 2000.  She was asked to return to see me in the office in three weeks.  She was given a prescription for Vicodin. DD:   05/06/00 TD:  05/06/00 Job: [id number]

## 2011-04-18 NOTE — Procedures (Signed)
[place]  Patient:    [place], RIEBE                          MRN: [id number] Proc. Date: 04/21/00 Adm. Date:  [id number] Disc. Date: [id number] Attending:  Brandy Hale CC:         Petra Kuba, M.D.             Angelia Mould. Derrell Lolling, M.D.                           Procedure Report  PROCEDURE PERFORMED:  Endoscopic retrograde cholangiopancreatography with sphincterotomy, injection therapy.  INDICATIONS:  Questionable CBD stone on ultrasound.  Consent was signed after risks, benefits, methods and options thoroughly discussed with the patient and multiple family.  MEDICATIONS:  Demerol 70, Versed 6.  PROCEDURE:  Side-viewing diagnostic video duodenoscope inserted by indirect vision into the stomach.  A normal-appearing antrum and pylorus were brought into view, and the ampulla was seen behind a fold which had to be moved out of the way. Using the triple lumen sphincterotome, initial injection revealed a normal-appearing pancreatic duct.  It was not overfilled, and only minimally filled. The sphincterotome was repositioned and after a few different maneuvers, we were able to get selective cannulation. We were able to advance the wire into the CBD. No initial stone was seen on any of the 3 views that were taken, or on any of the ot fluoroscopic pictures. The cystic duct was patent; the duct was normal caliber.  Wire was advanced into the intrahepatics.  Because of the question of the CBD stones on ultrasound, I went ahead and proceeded with a sphincterotomy, after only a few bursts of cautery with a small sphincterotomy, although there was some drainage of contrast at this junction.  There was some bleeding, it was not pulsatile, but did not stop with washing and watching. We went ahead and removed the wire and the sphincterotome, and injected 1 cc just above the sphincterotomy site of 1:10,000 epinephrine. A nice bleb was raised.   Bleeding seemed to stop instantaneously.  The area was washed with a few more cc of epinephrine without  further bleeding. At that juncture, we tried to recannulate using the balloon catheter, but were unsuccessful, and went ahead and switched back to the sphincterotome.  But, due to the edema from the injection, could not recannulate. We did try to probe the ampulla with the wire, and also, to try when we thought we had cannulated to reinject contrast, but a submucosal bleb was raised.  After a few more unsuccessful attempts at cannulation, difficult visualization; we elected to stop the procedure at this juncture.  Dr. Claud Kelp had come by to see how we were doing, and we did review the pictures with him.  Based on lack f stone and some drainage, we felt comfortable stopping at this juncture. Scope was removed.  The patient tolerated the procedure well.  There were minimal complications from bleeding, and the submucosal injection with both epinephrine and some contrast.  ENDOSCOPIC DIAGNOSES:  1. Normal ampulla.  2. Believe normal common bile duct - no stones seen.  3. Patent cystic duct.  4. Normal pyloric duct on few injections therapy.  5. Small sphincterotomy with some drain bleeding, requiring injection of  epinephrine 1 cc into the wall with a nice blebbing and sensation of bleeding.  We did wash  further epinephrine.  Unfortunately, unable to cannulate with  either the balloon catheter or the sphincterotome with the wire, after the     injection secondary to the wall edema.  PLAN:  Will observe for delayed complications.  If none, proceed with laparoscopic cholecystectomy and intraoperative cholangiogram tomorrow; otherwise, might consider repeat ERCP particularly if stone is found, or if signs of cholangitis  develop from the edema. DD:  04/21/00 TD:  04/26/00 Job: [id number]

## 2011-04-18 NOTE — H&P (Signed)
[facility]  Patient:    [place], Ellen [place]                          MRN: [id number] Adm. Date:  [id number] Disc. Date: [id number] Attending:  Carmelina Peal CC:         Petra Kuba, M.D.                         History and Physical  CHIEF COMPLAINT:  Upper abdominal pain, nausea and vomiting.  HISTORY OF PRESENT ILLNESS:  This is a 77 year old white female without significant prior GI problems.  On Saturday, Apr 18, 2000, she developed epigastric pain, right upper quadrant pain, and mild back pain, and repeated bouts of nausea and vomiting.  She was constipated and took a laxative, but it did not help.  She has not had any prior similar episodes.  She went to the [facility] Emergency Room and was evaluated by the emergency department physician. She had lab work which showed a white blood cell count of 19,000.  Liver function tests were normal.  A CT scan of the abdomen was normal, except that it showed a slightly enlarged uterus.  No other abnormalities were noted.  An ultrasound of the gallbladder was obtained later and showed multiple gallstones and strongly suggested a 5.5 mm diameter common bile duct stones. The gallbladder wall was not thickened.  There was no fluid around the gallbladder and the biliary ducts were not dilated.  She was feeling better and stated that she wanted to go back to her home in [place] to be taken care of and was sent home.  She had a good day yesterday, Sunday, Apr 19, 2000, and had really no symptoms, was feeling okay and was able to eat. Today, she developed recurrent upper abdominal pain, back pain, and nausea and vomiting after breakfast.  She called our office and wanted to come in to the hospital for definitive treatment here in [place].  She came to [facility] Emergency Room and is being admitted.  PAST HISTORY: 1. She has a history of hypothyroidism. 2. History of bilateral tubal ligation many years ago. 3. She is  status post abdominoplasty. 4. She is status post blepharoplasty and face lift. 5. She is status post bilateral cataract surgery. 6. She has had seven pregnancies and seven vaginal deliveries.  CURRENT MEDICATIONS: 1. Premarin. 2. Provera. 3. Synthroid 0.2 mg q.d. 4. Vitamin E.  DRUG ALLERGIES:  CODEINE CAUSES NAUSEA AND VOMITING.  FAMILY HISTORY:  Father died age 101, unknown cause, possible cancer.  Mother died age 100, had pancreatic cancer and insulin-dependent diabetes. She has a half-brother who has borderline diabetes and a half-sister who is well.  SOCIAL HISTORY:  The patient is married.  They live in [place].  They have seven children.  She quit smoking in 1987.  She drinks one glass of wine or one mixed drink per day.  REVIEW OF SYSTEMS:  Noncontributory.  PHYSICAL EXAMINATION:  GENERAL:  Pleasant, overweight white female who was in significant distress when she came to the emergency room, but after 50 mg of Demerol IV and some Phenergan she feels better, although still feels some pain.  VITAL SIGNS:  Temperature 97.6, pulse 97, respiratory rate 16, blood pressure 184/86.  HEENT:  Sclerae clear.  Extraocular movements intact.  Blepharoplasty scars noted.  Oropharynx:  She has upper and lower dentures.  No  lesions.  NECK:  Supple, nontender, no mass, no adenopathy, no bruit, no thyromegaly.  LUNGS:  Clear to auscultation.  No CVA tenderness.  CARDIAC:  Heart regular rate and rhythm.  No murmur.  ABDOMEN:  Obese.  Soft.  Minimally tender in the right upper quadrant and epigastrium.  No mass, no guarding, no rebound.  No distention.  RECTAL:  Exam not repeated.  Reportedly showed no blood in the [facility] ER.  EXTREMITIES:  No edema.  Good pulses.  NEUROLOGIC:  Grossly within normal limits.  ADMISSION DATA:  Cardiac enzymes are negative.  Urinalysis negative.  CBC shows a white count of 10,400, hemoglobin 14.7, amylase of 32, SGOT 45.  Other liver function  tests normal.  IMPRESSION: 1. Chronic cholecystitis with cholelithiasis.  Possibly resolving acute    cholecystitis. 2. Rule out common bile duct stones.  PLAN:  The patient will be admitted and placed on IV antibiotics and kept NPO. She will certainly need cholecystectomy this admission and she agrees.  I have asked Dr. Leary Roca to see the patient for consideration of preop ERCP. Further decisions pending his evaluation. DD:  04/20/00 TD:  04/20/00 Job: [id number]

## 2011-04-18 NOTE — Procedures (Signed)
HISTORY:  This is an 77 year old patient who is being evaluated for a fall  that occurred 2 weeks ago.  The patient has no recollection of the fall.  The patient is being evaluated for possible seizures.  This is a routine  EEG.  No skull defects are noted.   EEG CLASSIFICATION:  Normal awake.   DESCRIPTION OF RECORDING:  The background rhythm of this recording consists  of a fairly well modulated medium amplitude alpha rhythm of 9-10 Hz that is  reactive eye opening and closure.  As the record progresses, the patient  appears to remain in the waking state throughout the entirety of the  recording.  Hyperventilation was not performed during the recording, but  photic stimulation was performed resulting in a bilateral and symmetric  photic driving response.  No evidence of focal slowing was seen at any time  during the recording.  No evidence of spike or spike wave discharges were  noted.  EKG monitor shows no evidence of cardiac arrhythmias with a heart  rate of 78.   IMPRESSION:  This is a normal EEG recording in the awakened state.  No  evidence of ictal or interictal discharges are seen.      VHQ:IONG  D:  10/07/2004 16:52:13  T:  10/07/2004 21:01:15  Job #:  [id number]

## 2011-04-18 NOTE — Op Note (Signed)
[facility]  Patient:    [place], [place]                          MRN: [id number] Proc. Date: 04/22/00 Adm. Date:  [id number] Disc. Date: [id number] Attending:  Brandy Hale CC:         Petra Kuba, M.D.             Angelia Mould. Derrell Lolling, M.D.                           Operative Report  PREOPERATIVE DIAGNOSIS:  Acute cholecystitis with cholelithiasis, rule out choledocholithiasis.  POSTOPERATIVE DIAGNOSIS:  Acute cholecystitis with cholelithiasis, rule out choledocholithiasis.  OPERATION:  Laparoscopic cholecystectomy with intraoperative cholangiogram.  SURGEON:  Angelia Mould. Derrell Lolling, M.D.  FIRST ASSISTANT:  Jimmye Norman, M.D.  INDICATION FOR PROCEDURE:  This is a 77 year old white female who had the onset of moderately severe epigastric pain, right upper quadrant pain, back pain, nausea and vomiting on Saturday, May 19. She was seen in the [facility] Emergency Room by the emergency department physician. A CT scan showed no abnormal findings except for a "enlarged uterus", which the patient is aware of and she is actually scheduled to see her gynecologist. An ultrasound showed multiple gallstones and also strongly suggested a 5.5 mm diameter common bile duct stone with shadowing. The biliary tree was not distended. She became asymptomatic and was sent home and was planning to go back to her home at [facility] but she became symptomatic again with recurrent epigastric pain, right upper quadrant pain, back pain and vomiting and had to be admitted to [facility] on the evening of Apr 20, 2000. Liver function tests show only elevation of SGOT. Amylase is normal. Dr. Vida Rigger performed ERCP yesterday and a sphincterotomy. The cholangiogram was incomplete but did not show any filling defects. There was some bleeding and edema from the ampulla which made it difficult to complete the cholangiogram. He therefore asked me to do a cholangiogram  today. She is brought to the operating room 24 hours following her ERCP.  OPERATIVE FINDINGS:  The gallbladder was acutely inflamed, discolored, thick walled, edematous and had extensive adhesions too suggesting prior inflammatory episodes. The operative cholangiogram was normal. There was no filling defect, normal intrahepatic and extrahepatic bile duct anatomy, and prompt flow of contrast into the duodenum. The liver appeared healthy. The stomach and duodenum, small intestine, and large intestine were grossly normal. She had had a previous abdominoplasty with repositioning of her umbilicus but tissues were pink and healthy and well perfused at the completion of the case. There was a lot of fibrosis around the umbilicus.  OPERATIVE TECHNIQUE:  Following the induction of general endotracheal anesthesia, the patients abdomen was prepped and draped in a sterile fashion. A 0.25% Marcaine with epinephrine was used as used as a local infiltration anesthetic. A transverse incision was made below the umbilicus, below previous circumferential umbilical scar. The tissues were fibrotic. The fascia was incised in the midline and the abdominal cavity entered under direct vision. A 10 mm Hasson trocar was inserted and secured with pursestring suture of #0 Vicryl. Pneumoperitoneum was created. A video camera was inserted with visualization and findings as described above. A 10 mm trocar was placed in the subxiphoid region and two 5 mm trocars placed in the right mid abdomen. The gallbladder was tense and distended and bile was  evacuated with a suction trocar. The gallbladder was then elevated and the adhesions were taken down. We dissected all the way down until we could clearly identify the infundibulum of the gallbladder, cystic duct, cystic artery, duodenum and common bile duct. We carefully dissected out the cystic duct and cystic artery. The cystic artery was secured with metal clips and divided.  We dissected out a generous length of cystic duct. We inserted a cholangiogram catheter into the cystic duct and secured this with a metal clip and performed cholangiography using a C arm. Real-time cholangiography was normal, there were no filling defects, no evidence of obstruction and the anatomy was conventional. The cholangiogram catheter was removed. The cystic duct was secured with 3 metal clips and divided. The gallbladder was dissected from its bed with electrocautery. Along the right border of the gallbladder, we entered a small superficial hepatic venous structure that had low pressure. This required some electrocautery and we controlled this bleeding with Surgicel. We were prepared to take sutures into this but actually at the completion of the case, this was not necessary after holding some Surgicel on this it became completely hemostatic and after 5 to 10 minutes of observation there was no recurrent bleeding and so we felt satisfied with this control.  The gallbladder dissection was completed and the gallbladder removed through the umbilical port. The subphrenic space and subhepatic space were copiously irrigated with saline. There was no bleeding and no bile leak whatsoever. We placed a small piece of Surgicel on the gallbladder bed. The trocars were removed under direct vision and there was no bleeding from the trocar sites. The pneumoperitoneum was released. The fascia in the umbilicus was closed with #0 Vicryl sutures. The skin incisions were closed with subcuticular sutures of 4-0 Vicryl and Steri-Strips. Clean bandages were placed and the patient taken to the recovery room in stable condition. Estimated blood loss was about 30 cc. Complications none. Sponge, needle and instrument count was correct. DD:  04/22/00 TD:  04/26/00 Job: [id number]

## 2011-04-18 NOTE — Consult Note (Signed)
[facility]  Patient:    Ellen [place], Ellen [place]                          MRN: [id number] Proc. Date: 04/20/00 Adm. Date:  [id number] Attending:  Brandy Hale CC:         Angelia Mould. Derrell Lolling, M.D.                          Consultation Report  REFERRING PHYSICIAN:  Angelia Mould. Derrell Lolling, M.D.  HISTORY OF PRESENT ILLNESS:  A patient with a longstanding hiatal hernia and some chronic constipation due to a rectocele who on Saturday presented to [facility] emergency room with increasing abdominal pain, right upper quadrant pain, radiation to the back and some nausea and vomiting despite a white count of 19 and ultrasound which showed CBD stone. She had felt better and was sent home since she lives in [place] with recommendations to seek a surgical consultation there. However, she had a good day on Sunday but her pain recurred today and represented to the emergency room where her lab work was actually improved. Her ultrasound did show CBD stones.  CAT scan was nonrevealing and I am consulted for consideration of a preop ERCP.  PAST MEDICAL HISTORY:  Pertinent for hypothyroidism, tubal ligation, abdominoplasty, blepharoplasty and facelift, cataracts.  MEDICATIONS:  Premarin, Progesterone and Synthroid.  ALLERGIES:  CODEINE.  FAMILY HISTORY:  Pertinent for her mother with pancreatic cancer but no other colon cancer, colon polyps, ulcers or obvious gallbladder problems in the family.  SOCIAL HISTORY:  Quit smoking in 1987, does drink socially.  REVIEW OF SYSTEMS:  Pertinent for increased reflux. She has had reflux in the past but has only over the counter Tagamet for that. She also has had the rectocele and chronic constipation and has had some lower bowel procedures in the past but none recently.  PHYSICAL EXAMINATION:  GENERAL:  Mildly uncomfortable but no significant distress.  VITAL SIGNS:  Stable, afebrile.  HEENT:  Sclera nonicteric.  NECK:  Supple  without obvious adenopathy.  LUNGS:  Clear.  HEART:  Regular rate and rhythm.  ABDOMEN:  Soft, essentially nontender, no guarding or rebound.  RECTAL:  By Dr. Derrell Lolling pertinent for being guaiac negative.  EXTREMITIES:  Good peripheral pulses, no pedal edema.  LABORATORY DATA:  Pertinent for a normal urinalysis. Normal CPK, isoenzymes.  Liver tests are essentially normal with an SGOT of 45, albumin 4.1, amylase normal, white count of 10.4 with a normal hemoglobin and platelet count, lipase normal. Ultrasound was reviewed with the radiologist which has in fact showed CBD stones. CAT scan report reviewed pertinent for being normal except for a large uterus. Her labs from [facility] 2 days ago were reviewed and other than the white count of 19.3, no other significant abnormalities including lipase and liver tests.  ASSESSMENT:  Common bile duct stones as well as gallstones.  PLAN:  The risks, benefits and methods of ERCP, sphincterotomy and stone removal were discussed with both the patient and multiple family members in comparison to open cholecystectomy, CBD exploration, lap chole and intraop cholangiogram in trying to remove the stones that way. I have also had a long talk with Dr. Derrell Lolling and we decided to proceed with an ERCP tomorrow with further workup and plans pending those findings. In the meantime, agreed with antibiotics. Would proceed sooner p.r.n. if signs of acute pancreatitis or bile duct obstruction cholangitis  but otherwise proceed tomorrow at 2 p.m. which is the first time that we can get her on the schedule. DD:  04/20/00 TD:  04/21/00 Job: [id number]

## 2011-05-21 ENCOUNTER — Inpatient Hospital Stay (HOSPITAL_COMMUNITY)
Admission: EM | Admit: 2011-05-21 | Discharge: 2011-05-23 | DRG: 690 | Disposition: A | Discharge status: Nursing Home (Includes ICF) | Payer: Medicare Other | Attending: Internal Medicine | Admitting: Internal Medicine

## 2011-05-21 ENCOUNTER — Emergency Department (HOSPITAL_COMMUNITY): Payer: Medicare Other

## 2011-05-21 DIAGNOSIS — E860 Dehydration: Secondary | ICD-10-CM | POA: Diagnosis present

## 2011-05-21 DIAGNOSIS — I959 Hypotension, unspecified: Secondary | ICD-10-CM | POA: Diagnosis present

## 2011-05-21 DIAGNOSIS — E039 Hypothyroidism, unspecified: Secondary | ICD-10-CM | POA: Diagnosis present

## 2011-05-21 DIAGNOSIS — Z9181 History of falling: Secondary | ICD-10-CM

## 2011-05-21 DIAGNOSIS — K219 Gastro-esophageal reflux disease without esophagitis: Secondary | ICD-10-CM | POA: Diagnosis present

## 2011-05-21 DIAGNOSIS — R55 Syncope and collapse: Secondary | ICD-10-CM | POA: Diagnosis present

## 2011-05-21 DIAGNOSIS — F341 Dysthymic disorder: Secondary | ICD-10-CM | POA: Diagnosis present

## 2011-05-21 DIAGNOSIS — F0390 Unspecified dementia without behavioral disturbance: Secondary | ICD-10-CM | POA: Diagnosis present

## 2011-05-21 DIAGNOSIS — R51 Headache: Secondary | ICD-10-CM | POA: Diagnosis present

## 2011-05-21 DIAGNOSIS — N390 Urinary tract infection, site not specified: Principal | ICD-10-CM | POA: Diagnosis present

## 2011-05-21 LAB — CBC
HCT: 41.3 % (ref 36.0–46.0)
MCH: 30.4 pg (ref 26.0–34.0)
MCV: 94.5 fL (ref 78.0–100.0)
RDW: 13 % (ref 11.5–15.5)
WBC: 13.7 10*3/uL — ABNORMAL HIGH (ref 4.0–10.5)

## 2011-05-21 LAB — CK TOTAL AND CKMB (NOT AT ARMC): Relative Index: INVALID (ref 0.0–2.5)

## 2011-05-21 LAB — COMPREHENSIVE METABOLIC PANEL
AST: 16 U/L (ref 0–37)
Albumin: 3.4 g/dL — ABNORMAL LOW (ref 3.5–5.2)
Alkaline Phosphatase: 84 U/L (ref 39–117)
BUN: 24 mg/dL — ABNORMAL HIGH (ref 6–23)
CO2: 23 mEq/L (ref 19–32)
Chloride: 103 mEq/L (ref 96–112)
Potassium: 4 mEq/L (ref 3.5–5.1)
Total Bilirubin: 0.2 mg/dL — ABNORMAL LOW (ref 0.3–1.2)

## 2011-05-21 LAB — URINALYSIS, ROUTINE W REFLEX MICROSCOPIC
Glucose, UA: 100 mg/dL — AB
Hgb urine dipstick: NEGATIVE
Ketones, ur: NEGATIVE mg/dL
Protein, ur: NEGATIVE mg/dL

## 2011-05-21 LAB — URINE MICROSCOPIC-ADD ON

## 2011-05-21 LAB — DIFFERENTIAL
Eosinophils Relative: 0 % (ref 0–5)
Lymphocytes Relative: 5 % — ABNORMAL LOW (ref 12–46)
Lymphs Abs: 0.7 10*3/uL (ref 0.7–4.0)
Monocytes Absolute: 1.1 10*3/uL — ABNORMAL HIGH (ref 0.1–1.0)
Monocytes Relative: 8 % (ref 3–12)

## 2011-05-21 LAB — TROPONIN I: Troponin I: <0.3 ng/mL (ref <0.30)

## 2011-05-22 ENCOUNTER — Inpatient Hospital Stay (HOSPITAL_COMMUNITY): Payer: Medicare Other

## 2011-05-22 DIAGNOSIS — R55 Syncope and collapse: Secondary | ICD-10-CM

## 2011-05-22 LAB — CBC
HCT: 41 % (ref 36.0–46.0)
MCHC: 32.4 g/dL (ref 30.0–36.0)
MCV: 95.3 fL (ref 78.0–100.0)
RDW: 13 % (ref 11.5–15.5)
WBC: 9 10*3/uL (ref 4.0–10.5)

## 2011-05-22 LAB — COMPREHENSIVE METABOLIC PANEL
ALT: 19 U/L (ref 0–35)
AST: 17 U/L (ref 0–37)
CO2: 27 mEq/L (ref 19–32)
Chloride: 104 mEq/L (ref 96–112)
GFR calc non Af Amer: >60 mL/min (ref >60)
Potassium: 4.6 mEq/L (ref 3.5–5.1)
Sodium: 138 mEq/L (ref 135–145)
Total Bilirubin: 0.4 mg/dL (ref 0.3–1.2)

## 2011-05-22 LAB — CARDIAC PANEL(CRET KIN+CKTOT+MB+TROPI): Troponin I: <0.3 ng/mL (ref <0.30)

## 2011-05-22 NOTE — H&P (Signed)
Ellen Hughes, Ellen Hughes NO.:  [id number]  MEDICAL RECORD NO.:  [id number]  LOCATION:  [facility]                         FACILITY:  [facility]  PHYSICIAN:  Talmage Nap, MD  DATE OF BIRTH:  04/11/1921  DATE OF ADMISSION:  05/21/2011 DATE OF DISCHARGE:                             HISTORY & PHYSICAL   Primary care physician is unassigned.  The patient's primary orthopedic surgeons are Dr. Jerl Santos and Dr. Carola Frost.  Dentist is Dr. Arlyce Dice. Ophthalmologist is Dr. Eulah Pont, dermatologist is Dr. Swaz[place], and Interventional Radiology is Dr. Corliss Skains.  History obtainable from the patient and the patient's son.  CHIEF COMPLAINT:  "Passed out while on my scooter at about 6:30 p.m. on 05/21/2011 at the assisted living facility."  HISTORY OF PRESENT ILLNESS:  The patient is an 77 year old very pleasant Caucasian female with a history of frequent falls, ambulates with the help of a scooter, and a resident of the assisted living facility, was said to have transiently passed out while on her scooter. The patient was attempting to go through a double door and just before getting there, she passed out for a very brief period of time and became unresponsive and unconscious.  There was no history of contact with the double doors.  The patient denied any premonitory symptoms prior to the onset of passing out.  She denied any chest pain.  She denied any shortness of breath.  She denied any nausea or vomiting.  No fever.  No chills.  No rigor.  Subsequently, was brought to the emergency room to be evaluated.  PAST MEDICAL HISTORY: 1. Positive for multiple falls. 2. Bilateral lens implant. 3. Depression. 4. GERD. 5. Hypothyroidism. 6. Chronic low back pain. 7. Osteoarthritis. 8. Chronic vertigo. 9. History of B12 deficiency. 10.Compression fracture of thoracic spine. 11.Peripheral neuropathy. 12.Spinal stenosis.  PAST SURGICAL HISTORY: 1. Bilateral cataract extraction with lens  implant. 2. Tubal ligation. 3. Cholecystectomy. 4. Tonsillectomy. 5. Compression fracture of thoracic spine status post kyphoplasty.  PREADMISSION MEDICATIONS: 1. Milk of magnesia 400 mg p.o. p.r.n. 2. Actonel 150 mg p.o. q. monthly. 3. Vitamin B12 oral 1000 mcg p.o. daily. 4. Align 4 mg p.o. daily. 5. Aspirin 81 mg p.o. daily. 6. Levothyroxine 88 mcg p.o. daily. 7. Meclizine 25 mg p.o. daily. 8. Omeprazole 20 mg p.o. daily. 9. Vitamin D plus omega-3 oil 5000 units p.o. daily. 10.Biotin 1 tablet p.o. daily. 11.Folic acid 0.8 mg p.o. daily. 12.Lutein 1 capsule p.o. daily. 13.Donepezil 10 mg p.o. daily. 14.Calcium 500 plus D oyster 600/400 one p.o. b.i.d. 15.Namenda 10 mg p.o. daily. 16.Magonate 27 mcg p.o. daily. 17.Xanax 0.25 mg p.o. p.r.n. 18.Ibuprofen 400 mg p.o. p.r.n. 19.Tylenol 500 mg p.o. p.r.n. 20.Meloxicam 1 tablet p.o. p.r.n. 21.Mucinex 600 mg p.o. p.r.n. 22.Ventolin inhaler HFA p.r.n. 23.Tramadol 50 mg p.o. p.r.n.  ALLERGIES: 1. CODEINE ANTITUSSIVE COUGH. 2. CODEINE PHOSPHATE SOLUBLE.  SOCIAL HISTORY:  Negative for alcohol or tobacco use.  The patient is a resident of assisted living.  FAMILY HISTORY:  Positive for diabetes mellitus.  REVIEW OF SYSTEMS:  The patient complains about intense temporal headache.  No nausea or vomiting.  No fever.  No chills.  No rigors. No chest  pain or shortness of breath.  Denies any cough.  Complains of epigastric as well as suprapubic discomfort with occasional dysuria. Denies any hematuria.  No diarrhea or hematochezia.  No swelling of the lower extremities.  No intolerance to heat or cold and no neuropsychiatric disorder.  PHYSICAL EXAMINATION:  GENERAL:  Very pleasant elderly lady complaining about temporal headaches, not in any  respiratory distress with suboptimal hydration. VITAL SIGNS:  Blood pressure is 143/89, pulse 77, respiratory rate 19, temperature is 97.5. HEENT:  Pupils are reactive to light and  extraocular muscles are intact. NECK:  No jugular venous distention.  No carotid bruit.  No lymphadenopathy. CHEST:  Clear to auscultation. HEART:  Sounds are 1 and 2. ABDOMEN:  Soft, nontender.  Liver, spleen, kidney not palpable.  Bowel sounds are positive. EXTREMITIES:  No pedal edema. NEUROLOGIC EXAM:  Nonfocal. MUSCULOSKELETAL SYSTEM:  Shows arthritic changes in the knees and in the feet. NEUROPSYCHIATRIC EVALUATION:  Unremarkable. SKIN:  Shows slightly decreased turgor.  LABORATORY DATA:  Urine microscopy showed wbc's of 3-6 with a few bacteria and urine microscopy showed nitrite negative with small leukocyte esterase.  Chemistry shows sodium of 138, potassium of 4.0, chloride of 102 with a bicarb of 23, glucose is 158, BUN is 24, creatinine 0.67.  LFTs are normal.  First set of cardiac markers; troponin I less than 0.30 normal, CK-MB is 2.4 normal.  Hematological indices showed WBC of 13.7, hemoglobin of 13.3, hematocrit of 41.3, MCV of 94.5 with a platelet count of 260, neutrophils 87%, absolute neutrophil count of 11.8.  IMAGING STUDIES:  EKG showed normal sinus rhythm with a rate of 75 with right bundle-branch block.  Imaging studies done include CT of the head without contrast which showed atrophy with extensive small vessel chronic ischemic changes deep in the cerebral white matter.  There is old left caudate head and questionable right frontal lacunar infarct. No acute intracranial abnormalities seen.  IMPRESSION: 1. Syncope. 2. Leukocytosis, etiology is unknown, questionable urinary tract     infection. 3. Temporal headache, rule out giant cell arteritis. 4. Hypothyroidism. 5. Chronic vertigo. 6. Depression. 7. Gastroesophageal reflux disease. 8. Osteoarthritis. 9. Dementia.  PLAN:  To admit the patient to telemetry.  The patient will be slowly hydrated with normal saline IV to go at a rate of 65 cc an hour.  She will be given aspirin 81 mg p.o. daily.  For  her temporal headache, the patient will be given Percocet 5/325 one to two tablets p.o. q.4 h. p.r.n. and she will be given prednisone 60 mg p.o. stat.  For hypothyroidism, she will be on Synthroid 88 mcg p.o. daily.  For her chronic vertigo, the patient will be on meclizine 25 mg p.o. daily and she also will be given donepezil 10 mg p.o. daily and Namenda 10 mg p.o. daily for her dementia.  The patient will be empirically treated for leukocytosis with Rocephin 1 g IV q.24 h. and Xanax 0.25 mg p.o. b.i.d. for anxiety.  GI prophylaxis will be with Protonix 40 mg p.o. daily and DVT prophylaxis with Lovenox 40 mg subcu q.24 h.  Further workup to be done on this patient will include cardiac enzymes q.6 h. x3 , thyroid panel which include TSH, T3, T4.  Urine and blood culture x2 before starting IV antibiotics.  CBCD, CMP, and magnesium will be repeated in a.m.  Imaging studies to be ordered will include MRI and MRA of head and neck, carotid duplex, and 2-D echo.  The  patient will be followed and evaluated on a day-to-day basis.     Talmage Nap, MD     CN/MEDQ  D:  05/22/2011  T:  05/22/2011  Job:  [id number]  Electronically Signed by Talmage Nap  on 05/22/2011 03:47:36 AM

## 2011-05-23 ENCOUNTER — Inpatient Hospital Stay (HOSPITAL_COMMUNITY)
Admit: 2011-05-23 | Discharge: 2011-05-23 | Disposition: A | Discharge status: Home / Self Care | Payer: Medicare Other | Attending: Internal Medicine | Admitting: Internal Medicine

## 2011-05-23 LAB — VITAMIN B12: Vitamin B-12: 282 pg/mL (ref 211–911)

## 2011-05-24 NOTE — Procedures (Unsigned)
EEG NUMBER:  REFERRING PHYSICIAN:  Conley Canal, MD  HISTORY:  An 77 year old female found unresponsive.  MEDICATIONS:  Milk of magnesia, Actonel, vitamin B12, Align, meclizine aspirin, Protonix, Lovenox.  CONDITIONS OF RECORDING:  This is 16-channel EEG carried out with the patient in the awake, drowsy, and asleep states.  DESCRIPTION:  The waking background activity can only be evaluated for a brief period of time.  During that time, a posterior background rhythm can be noted approximately 9 Hz alpha.  This is seen from the parieto- occipital and posterotemporal regions.  Low-voltage fast activity poorly organized was seen anteriorly at times, superimposed on more posterior rhythms.  A mixture of theta and alpha was seen from the central and temporal regions.  The patient spends the majority of the tracing drowsing with slowing to irregular, low voltage, theta and beta activity.  The patient goes into a light sleep with symmetrical sleep spindles everted with a sharp activity and irregular slow activity. Hypoventilation was performed and produced a moderate buildup, but failed to elicit any abnormalities.  Intermittent photic stimulation was not performed.  IMPRESSION:  This is a normal EEG.          ______________________________ Thana Farr, MD    [id number] D:  05/23/2011 15:36:55  T:  05/24/2011 01:55:05  Job #:  [id number]

## 2011-05-28 LAB — CULTURE, BLOOD (ROUTINE X 2)
Culture  Setup Time: 201206210854
Culture: NO GROWTH

## 2011-06-12 NOTE — Discharge Summary (Signed)
[name], [name] NO.:  [id number]  MEDICAL RECORD NO.:  [id number]  LOCATION:  1404                         FACILITY:  [facility]  PHYSICIAN:  Gwenyth Bender, NP      DATE OF BIRTH:  [birth year]  DATE OF ADMISSION:  05/21/2011 DATE OF DISCHARGE:  05/23/2011                              DISCHARGE SUMMARY   PRIMARY CARE PROVIDER:  Gentry Fitz.  DISCHARGE DIAGNOSES: 1. Syncope likely secondary to urinary tract     infection/dehydration/hypotension. 2. Urinary tract infection. 3. Temporal headache, rule out giant-cell arteritis. 4. Hypothyroidism. 5. Gastroesophageal reflux disease. 6. Depression/anxiety. 7. Dementia.  DISCHARGE MEDICATIONS: 1. Ceftin 250 mg p.o. b.i.d., take for 7 more days. 2. Align 4 mg p.o. daily. 3. Alprazolam 0.25 mg p.o. daily as needed for anxiety. 4. Aspirin 81 mg p.o. daily. 5. Biotin 1 mg p.o. every morning. 6. Calcium carbonate/vitamin D 1 tablet p.o. b.i.d. 7. Deep Sea 0.65% spray saline mist, 4 sprays nasally 4 times a day as     needed for stuffy nose. 8. Aricept 10 mg p.o. daily. 9. Folic acid 0.8 mg p.o. daily. 10.Guaifenesin/dextromethorphan 1 tablet p.o. b.i.d. as needed for     cough. 11.Ibuprofen 400 mg p.o. every 8 hours as needed for pain. 12.Levothroid 88 mcg p.o. daily. 13.Lutein 20 mg p.o. every morning. 14.Magnesium gluconate 500 mg p.o. daily at bedtime. 15.Meclizine 25 mg p.o. daily as needed for dizziness. 16.Milk of magnesia 400 mg p.o. give 3 times weekly as needed for     constipation. 17.MiraLAX 17 g p.o. daily as needed for constipation. 18.Namenda 10 mg p.o. b.i.d. 19.Omeprazole 20 mg p.o. daily. 20.Senna 8.6 mg p.o. daily as needed for constipation. 21.Tramadol 50 mg p.o. every 6 hours as needed for pain. 22.Tylenol Extra Strength 500 mg 2 tablets p.o. every 6 hours as     needed for pain. 23.Ventolin inhaler 1 puff daily as needed for shortness of breath. 24.Vitamin D2 50,000 units 1 tablet p.o.  daily.  DIAGNOSTIC LABS:  WBC is 13.7, hemoglobin 13.3, hematocrit 41.3, platelets 260, neutrophils 87%, absolute neutrophils 11.8.  Sodium 138, potassium 4.0, chloride 103, CO2 23, BUN 24, creatinine 0.67, glucose 158.  Total bili 0.2, albumin 3.4.  Total CK 29, CK-MB 2.1, troponin-I less than 0.30.  Second set of cardiac enzymes, total CK 36, CK-MB 3.3, troponin-I less than 0.30.  Urinalysis yields small leukocytes, small bilirubin, cloudy in appearance, 3-6 WBCs, few bacteria.  Magnesium level 1.9.  ESR 17.  T4 7.8, T 3 55.1. Vitamin B12 288.  TSH 0.196. MRSA screening negative.  DIAGNOSTIC IMAGING: 1. On June 21, MRI of the head yields no acute infarcts, no     intracranial hemorrhage.  Remote infarct left caudate.  Prominent     small-vessel disease type changes.  Global atrophy.  No     intracranial mass or lesion detected on this unenhanced exam. 2. MRI of the neck yields kyphosis of upper cervical spine with spinal     stenosis and mild cord flattening C3-4 and C4-5.  Opacification     left sphenoid sinus air cells with restricted motion suggesting     sinusitis. 3. MRA  of the head yields nonvisualization on PICA. 4. MRA of the neck shows flow within portions of the internal carotid     arteries and vertebral arteries bilaterally. 5. EEG done on June 22, interpretation pending.  Will call results to     facility where the patient is being discharged.  CONSULTS:  None.  PROCEDURES:  None.  BRIEF HISTORY:  Ms. Ellen Hughes is a very pleasant 77 year old female with a history of frequent falls, who ambulates with walker and uses a scooter for long distances within her assisted living facility, presented to the [facility] ED on June 21 and was said to have transiently passed out while on her scooter.  Witnesses said the patient had finished lunch and was on her scooter getting ready to leave the cafeteria and just suddenly stopped in her scooter and her eyes closed.  The  patient indicates she has no memory of the event, but her first memory is people standing around her saying open your eyes Leanor.  There was no report of a fall.  No incontinence.  No injury.  The patient denied any lightheadedness, nausea, vomiting or recent illnesses.  She denied chest pain or shortness of breath.  Hospitalists were asked to admit for further evaluation and treatment.  HOSPITAL COURSE: 1. Syncope, likely secondary to urinary tract infection leading to     dehydration and hypotension.  The patient was slightly hypotensive     while in the emergency room.  She was admitted to telemetry floor.     She had a 2-D echo which yielded an ejection fraction of 60% to     65%.  Carotid Dopplers were negative.  EEG results are pending.     She had no further neurological events.  She was monitored on     telemetry and was without arrhythmias. 2. Urinary tract infection:  The patient was started on Rocephin IV.     She got 2 days of this and was switched over to Ceftin 250 mg p.o.;     she will continue with that for 7 more days. 3. Temporal headache.  On presentation to the emergency room, the     patient complained of a temporal headache.  She was given a dose of     prednisone.  Her ESR is 17.  Within several hours, the patient's     headache resolved.  At the time of discharge, the patient is     headache free. 4. Hypothyroidism:  The patient was continued on her home medications     at the same dosage.  TSH level is 0.196. 5. GERD remained at baseline.  The patient continued on PPI. 6. Depression/anxiety remained at baseline.  Continued her p.r.n.     medications. 7. Dementia remained at baseline during this hospitalization.     Continued with her Namenda and Aricept.  PHYSICAL EXAM:  VITAL SIGNS:  Temperature 97.4, blood pressure 150/79, heart rate 66, respiratory rate16, saturation 94% on room air.  GENERAL: Awake, alert, well-nourished, in no acute  distress. CARDIOVASCULAR:  Regular rate and rhythm.  No murmur, gallop or rub. Trace lower extremity edema. RESPIRATORY:  Normal effort.  Breath sounds are clear to auscultation bilaterally.  No rhonchi, wheezes or rales. ABDOMEN:  Round, soft.  Positive bowel sounds throughout.  Abdomen nontender to palpation. NEUROLOGIC:  Alert and oriented x2.  Speech clear.  Facial symmetry.  FOLLOWUP:  The patient is to follow up with her primary care provider in 1 to  2 weeks.  DISPOSITION:  The patient is medically stable and will be discharged to her assisted living facility.  Time spent with the patient, 40 minutes.  Dictated For:  Conley Canal, MD     Gwenyth Bender, NP     KMB/MEDQ  D:  05/23/2011  T:  05/23/2011  Job:  [id number]  Electronically Signed by Toya Smothers  on 06/12/2011 07:46:03 AM

## 2011-09-09 LAB — BASIC METABOLIC PANEL
CO2: 28
Calcium: 9.1
Creatinine, Ser: 0.65
GFR calc Af Amer: >60
GFR calc non Af Amer: >60
Sodium: 137

## 2011-09-09 LAB — POCT I-STAT CREATININE
Creatinine, Ser: 0.7
Operator id: 294501

## 2011-09-09 LAB — PHOSPHORUS: Phosphorus: 3.8

## 2011-09-09 LAB — CARDIAC PANEL(CRET KIN+CKTOT+MB+TROPI)
CK, MB: 1.1
CK, MB: 1.2
CK, MB: 1.2
Relative Index: INVALID
Total CK: 33
Total CK: 36
Total CK: 39
Troponin I: 0.02
Troponin I: 0.03

## 2011-09-09 LAB — I-STAT 8, (EC8 V) (CONVERTED LAB)
Acid-Base Excess: 3 — ABNORMAL HIGH
BUN: 12
Bicarbonate: 28 — ABNORMAL HIGH
HCT: 42
Hemoglobin: 14.3
Operator id: 294501
Sodium: 136
TCO2: 29
pCO2, Ven: 43.2 — ABNORMAL LOW

## 2011-09-09 LAB — DIFFERENTIAL
Basophils Absolute: 0
Eosinophils Relative: 1
Lymphocytes Relative: 11 — ABNORMAL LOW
Lymphs Abs: 1.2
Neutro Abs: 8.6 — ABNORMAL HIGH

## 2011-09-09 LAB — URINALYSIS, ROUTINE W REFLEX MICROSCOPIC
Glucose, UA: NEGATIVE
Nitrite: NEGATIVE
Protein, ur: NEGATIVE
pH: 7.5

## 2011-09-09 LAB — D-DIMER, QUANTITATIVE: D-Dimer, Quant: 2.02 — ABNORMAL HIGH

## 2011-09-09 LAB — MAGNESIUM: Magnesium: 1.9

## 2011-09-09 LAB — CBC
HCT: 38.7
Hemoglobin: 11.1 — ABNORMAL LOW
MCHC: 33.4
Platelets: 411 — ABNORMAL HIGH
RBC: 3.48 — ABNORMAL LOW
WBC: 10.7 — ABNORMAL HIGH

## 2011-09-10 LAB — CBC
HCT: 29.4 — ABNORMAL LOW
Hemoglobin: 10 — ABNORMAL LOW
MCV: 95.7
WBC: 12.6 — ABNORMAL HIGH

## 2011-09-10 LAB — BASIC METABOLIC PANEL
Chloride: 103
GFR calc non Af Amer: >60
Potassium: 4.5
Sodium: 134 — ABNORMAL LOW

## 2011-09-11 LAB — CBC
HCT: 34.8 — ABNORMAL LOW
HCT: 42.8
Hemoglobin: 11.8 — ABNORMAL LOW
MCHC: 33.4
MCV: 96.6
MCV: 96.7
Platelets: 330
RBC: 3.61 — ABNORMAL LOW
RDW: 13.9
WBC: 15.7 — ABNORMAL HIGH

## 2011-09-11 LAB — BASIC METABOLIC PANEL
BUN: 11
BUN: 13
CO2: 30
Chloride: 101
Chloride: 104
Creatinine, Ser: 0.56
Glucose, Bld: 96
Potassium: 4
Potassium: 4.9
Sodium: 138

## 2011-09-11 LAB — HEMOGLOBIN AND HEMATOCRIT, BLOOD: HCT: 33.9 — ABNORMAL LOW

## 2011-09-12 LAB — CBC
HCT: 39.8
MCHC: 33.8
MCV: 96.6
Platelets: 394

## 2011-09-12 LAB — BASIC METABOLIC PANEL
BUN: 12
CO2: 25
Chloride: 101
Creatinine, Ser: 0.79
Glucose, Bld: 108 — ABNORMAL HIGH

## 2011-09-12 LAB — PROTIME-INR: Prothrombin Time: 12.2

## 2015-03-07 ENCOUNTER — Encounter: Payer: Self-pay | Admitting: Nurse Practitioner

## 2015-03-07 ENCOUNTER — Non-Acute Institutional Stay: Payer: Medicare Other | Admitting: Nurse Practitioner

## 2015-03-07 VITALS — BP 120/70 | HR 72 | Temp 97.5°F | Wt 202.0 lb

## 2015-03-07 DIAGNOSIS — M1990 Unspecified osteoarthritis, unspecified site: Secondary | ICD-10-CM | POA: Diagnosis not present

## 2015-03-07 DIAGNOSIS — Z299 Encounter for prophylactic measures, unspecified: Secondary | ICD-10-CM

## 2015-03-07 DIAGNOSIS — N390 Urinary tract infection, site not specified: Secondary | ICD-10-CM | POA: Diagnosis not present

## 2015-03-07 DIAGNOSIS — Z418 Encounter for other procedures for purposes other than remedying health state: Secondary | ICD-10-CM

## 2015-03-07 DIAGNOSIS — E559 Vitamin D deficiency, unspecified: Secondary | ICD-10-CM | POA: Diagnosis not present

## 2015-03-07 DIAGNOSIS — E039 Hypothyroidism, unspecified: Secondary | ICD-10-CM

## 2015-03-07 DIAGNOSIS — S93401D Sprain of unspecified ligament of right ankle, subsequent encounter: Secondary | ICD-10-CM

## 2015-03-07 DIAGNOSIS — F0390 Unspecified dementia without behavioral disturbance: Secondary | ICD-10-CM | POA: Diagnosis not present

## 2015-03-07 NOTE — Progress Notes (Signed)
Patient ID: Ellen Hughes, female   DOB: 03/07/1921, 81 y.o.   MRN: [id number]    Nursing [facility]  [facility]   Place of Service: Clinic (12)  PCP: REED, TIFFANY, DO  Allergies  Allergen Reactions  . Codeine   . Morphine And Related     Chief Complaint  Patient presents with  . Medical Management of Chronic Issues    New Patient    HPI:  Patient is a 81 y.o. female [facility]. Pt previously being seen by Dr Ricki MillerPang, who is moving. Pt with MMSE of 16/30, on March 2016. Pt with a PMH of OA, dementia, hypothyroid, vit d def, depression.  Pt has fracture boot on right foot/leg Not much pain.  Thinks she sees orthopedics. Reviewed notes appears she is [facility] up [facility]. Spoke with staff and she is currently receiving PT at [facility].    Review of Systems:  Review of Systems  Constitutional: Negative for activity change, appetite change, fatigue and unexpected weight change.  HENT: Negative for congestion and hearing loss.   Eyes: Negative.   Respiratory: Negative for cough and shortness of breath.   Cardiovascular: Negative for chest pain, palpitations and leg swelling.  Gastrointestinal: Negative for abdominal pain, diarrhea and constipation.  Genitourinary: Negative for dysuria and difficulty urinating.       Incontinent of bladder  Musculoskeletal: Positive for arthralgias (to right ankle). Negative for myalgias.  Skin: Negative for color change and wound.  Neurological: Negative for dizziness and weakness.  Psychiatric/Behavioral: Negative for behavioral problems, confusion and agitation.    Past Medical History  Diagnosis Date  . Irregular sleep-wake rhythm, nonorganic origin   . Hypothyroidism   . Vitamin B 12 deficiency   . Hereditary and idiopathic neuropathy   . Gastroesophageal reflux disease without esophagitis   . Osteoarthritis   .  Abnormality of gait and mobility    History reviewed. No pertinent past surgical history. Social History:   reports that she has never smoked. She has never used smokeless tobacco. She reports that she drinks alcohol. She reports that she does not use illicit drugs.  History reviewed. No pertinent family history.  Medications: Patient's Medications  New Prescriptions   No medications on file  Previous Medications   ASPIRIN 81 MG CHEWABLE TABLET    Chew by mouth daily.   BIOTIN 1000 MCG TABLET    Take 1,000 mcg by mouth. Take one tablet each moring   CALCIUM CARBONATE-VITAMIN D 600-400 MG-UNIT PER TABLET    Take 1 tablet by mouth daily.   CEFUROXIME (CEFTIN) 250 MG TABLET    Take 250 mg by mouth. Take one tablet each morning   CHOLECALCIFEROL (VITAMIN D3) 2000 UNITS TABS    Take by mouth. Take one each morning   DONEPEZIL (ARICEPT) 10 MG TABLET    Take 10 mg by mouth at bedtime. For memory   FLUTICASONE (FLONASE) 50 MCG/ACT NASAL SPRAY    Place 1 spray into both nostrils. Twice daily   FOLIC ACID (FOLVITE) 800 MCG TABLET    Take 400 mcg by mouth daily.   HYDROCORTISONE 1 % LOTION    Apply 1 application topically. Apply to face as needed   IBANDRONATE (BONIVA) 150 MG TABLET    Take 150 mg by mouth every 30 (thirty) days. Take in the morning with a full glass of water, on an empty stomach, and do not take anything  else by mouth or lie down for the next 30 min.   IBUPROFEN (ADVIL,MOTRIN) 800 MG TABLET    Take 800 mg by mouth every 8 (eight) hours as needed. pain   LEVOTHYROXINE (SYNTHROID, LEVOTHROID) 75 MCG TABLET    Take 75 mcg by mouth daily before breakfast.   LUTEIN 20 MG CAPS    Take by mouth. Take one tablet each morning   MAGNESIUM OXIDE (MAG-OX) 400 MG TABLET    Take 400 mg by mouth daily. For constipation   MEMANTINE (NAMENDA) 10 MG TABLET    Take 10 mg by mouth 2 (two) times daily.   OMEPRAZOLE (PRILOSEC) 20 MG CAPSULE    Take 20 mg by mouth. Take one capsule once a morning *DO NOT  CRUSH* TAKE ON EMPTY STOMACH*   PRAMIPEXOLE (MIRAPEX) 0.25 MG TABLET    Take 0.25 mg by mouth. Take one tablet at bedtime   PROBIOTIC PRODUCT (ALIGN) 4 MG CAPS    Take 4 mg by mouth. Take one each morning   SERTRALINE (ZOLOFT) 50 MG TABLET    Take 50 mg by mouth daily.  Modified Medications   No medications on file  Discontinued Medications   No medications on file     Physical Exam: Filed Vitals:   03/07/15 1445  BP: 120/70  Pulse: 72  Temp: 97.5 F (36.4 C)  TempSrc: Oral  Weight: 202 lb (91.627 kg)  SpO2: 99%    Physical Exam  Constitutional: She is oriented to person, place, and time. She appears well-developed and well-nourished. No distress.  HENT:  Head: Normocephalic and atraumatic.  Mouth/Throat: Oropharynx is clear and moist. No oropharyngeal exudate.  Eyes: Conjunctivae are normal. Pupils are equal, round, and reactive to light.  Neck: Normal range of motion. Neck supple.  Cardiovascular: Normal rate, regular rhythm and normal heart sounds.   Pulmonary/Chest: Effort normal and breath sounds normal.  Abdominal: Soft. Bowel sounds are normal.  Musculoskeletal: She exhibits no edema.  Neurological: She is alert and oriented to person, place, and time.  Skin: Skin is warm and dry. She is not diaphoretic.  Psychiatric: She has a normal mood and affect. Her speech is normal and behavior is normal. Cognition and memory are impaired. She exhibits abnormal recent memory.    Labs reviewed: Basic Metabolic Panel: No results for input(s): NA, K, CL, CO2, GLUCOSE, BUN, CREATININE, CALCIUM, MG, PHOS in the last 8760 hours. Liver Function Tests: No results for input(s): AST, ALT, ALKPHOS, BILITOT, PROT, ALBUMIN in the last 8760 hours. No results for input(s): LIPASE, AMYLASE in the last 8760 hours. No results for input(s): AMMONIA in the last 8760 hours. CBC: No results for input(s): WBC, NEUTROABS, HGB, HCT, MCV, PLT in the last 8760 hours. TSH: No results for input(s):  TSH in the last 8760 hours. A1C: No results found for: HGBA1C Lipid Panel: No results for input(s): CHOL, HDL, LDLCALC, TRIG, CHOLHDL, LDLDIRECT in the last 8760 hours.09  Assessment/Plan 1. Preventive measure - DNR (Do Not Resuscitate) placed in epic  2. Chronic UTI -has been on ceftin long term  3. Dementia without behavioral disturbance -pt is a poor historian, requires assistance from staff for ADLs, currently on aricept and namenda 10 mg daily -will  Update labs and if kidney function appropriate will change namenda to XR and increase to 28 mg.  4. Vitamin D deficiency Currently on VitD 2000 units, will follow up Vit D level  5. Hypothyroidism, unspecified hypothyroidism type Synthroid 75 mcg, will get TSH  level  6. Osteoarthritis, unspecified osteoarthritis type, unspecified site Denies any pain at this time except for right ankle discomfort due to pain, taking ibuprofen 800 mg q 8 hours PRN  7. Ankle Sprain Currently in fracture boot and working with therapy, occasional pain, and getting medication frequently will stop ibuprofen due to side effects and start tylenol 650 mg q 6 hr PRN   To follow up in 4 weeks with daughter at visit.

## 2015-03-08 ENCOUNTER — Telehealth: Payer: Self-pay | Admitting: *Deleted

## 2015-03-08 LAB — HEPATIC FUNCTION PANEL
ALT: 13 U/L (ref 7–35)
AST: 15 U/L (ref 13–35)
Alkaline Phosphatase: 77 U/L (ref 25–125)
BILIRUBIN, TOTAL: 0.5 mg/dL

## 2015-03-08 LAB — CBC AND DIFFERENTIAL
HEMATOCRIT: 39 % (ref 36–46)
HEMOGLOBIN: 12.7 g/dL (ref 12.0–16.0)
PLATELETS: 314 10*3/uL (ref 150–399)
WBC: 7.1 10*3/mL

## 2015-03-08 LAB — BASIC METABOLIC PANEL
BUN: 24 mg/dL — AB (ref 4–21)
CREATININE: 0.8 mg/dL (ref 0.5–1.1)
GLUCOSE: 100 mg/dL
POTASSIUM: 4.4 mmol/L (ref 3.4–5.3)
SODIUM: 140 mmol/L (ref 137–147)

## 2015-03-08 LAB — TSH: TSH: 11.82 u[IU]/mL — AB (ref 0.41–5.90)

## 2015-03-08 NOTE — Telephone Encounter (Signed)
Beth with [facility] called and stated that you ordered a TSH yesterday and the TSH is abnormal at 11.821. Last TSH was Normal 08/2013. Patient is currently taking 75mcg of Synthroid. Please Advise. Nurse called back and stated that Fletcher Anonhristina Wert, NP will address.

## 2015-03-08 NOTE — Telephone Encounter (Signed)
Noted, thank you

## 2015-04-11 ENCOUNTER — Encounter: Payer: Self-pay | Admitting: Nurse Practitioner

## 2015-04-17 LAB — TSH: TSH: 4.23 u[IU]/mL (ref 0.41–5.90)

## 2015-04-18 ENCOUNTER — Non-Acute Institutional Stay: Payer: Medicare Other | Admitting: Nurse Practitioner

## 2015-04-18 ENCOUNTER — Encounter: Payer: Self-pay | Admitting: Nurse Practitioner

## 2015-04-18 VITALS — BP 128/72 | HR 80 | Temp 97.7°F | Wt 200.0 lb

## 2015-04-18 DIAGNOSIS — S93401D Sprain of unspecified ligament of right ankle, subsequent encounter: Secondary | ICD-10-CM | POA: Diagnosis not present

## 2015-04-18 DIAGNOSIS — E039 Hypothyroidism, unspecified: Secondary | ICD-10-CM | POA: Diagnosis not present

## 2015-04-18 DIAGNOSIS — E559 Vitamin D deficiency, unspecified: Secondary | ICD-10-CM | POA: Diagnosis not present

## 2015-04-18 DIAGNOSIS — F0390 Unspecified dementia without behavioral disturbance: Secondary | ICD-10-CM

## 2015-04-18 DIAGNOSIS — K5900 Constipation, unspecified: Secondary | ICD-10-CM

## 2015-04-18 DIAGNOSIS — M1990 Unspecified osteoarthritis, unspecified site: Secondary | ICD-10-CM | POA: Diagnosis not present

## 2015-04-18 NOTE — Progress Notes (Signed)
Patient ID: Ellen Hughes, female   DOB: 25-Dec-1920, 81 y.o.   MRN: [id number]    Nursing Home Location:  [facility] [employer] of Service: Clinic (12)  PCP: REED, TIFFANY, DO  Allergies  Allergen Reactions  . Codeine   . Morphine And Related     Chief Complaint  Patient presents with  . Medical Management of Chronic Issues    thyroid, memory Here with daughter [name]    HPI:  Patient is a 81 y.o. female seen today at [facility], clinic for routine follow up.  Pt with a PMH of OA, dementia, hypothyroid, vit d def, depression.  MMSE of 16/30 in March of 2016. Pt is a poor historian and here with her daughter today. Has completed PT on right ankle. No pain noted.  Hx of falls, broke left arm, nose and face on one fall, found to have foot drop from hospital PT which was causing the falls.Hx of surgery of left arm due to not healing correctly after fall.  Had another fall that resulted in increased back pain.  Had 7 months of rehab and then transited to [place], which was 7 years ago and she has done well since Hx of depression, started when she turned 53, because she did not want to be that old, once she turned 89 her spirits improved and she has been good since.  Has lost a lot of strength in the last months, trying to move from one seat to another has been difficult.   Review of Systems:  Review of Systems  Constitutional: Negative for activity change, appetite change, fatigue and unexpected weight change.  HENT: Negative for congestion and hearing loss.   Eyes: Negative.   Respiratory: Negative for cough and shortness of breath.   Cardiovascular: Negative for chest pain, palpitations and leg swelling.  Gastrointestinal: Negative for abdominal pain, diarrhea and constipation.  Genitourinary: Negative for dysuria and difficulty urinating.       Incontinent of bladder  Musculoskeletal: Negative for myalgias and arthralgias.  Skin: Negative for color  change and wound.  Neurological: Negative for dizziness and weakness.  Psychiatric/Behavioral: Negative for behavioral problems, confusion and agitation.    Past Medical History  Diagnosis Date  . Irregular sleep-wake rhythm, nonorganic origin   . Hypothyroidism   . Vitamin B 12 deficiency   . Hereditary and idiopathic neuropathy   . Gastroesophageal reflux disease without esophagitis   . Osteoarthritis   . Abnormality of gait and mobility   . Osteoporosis   . Depression    Past Surgical History  Procedure Laterality Date  . Cosmetic surgery    . Cholecystectomy    . T12 vertebroplasty    . Orif nonunion humerus fracture Left     OP1  . Allograft application  2008   Social History:   reports that she has never smoked. She has never used smokeless tobacco. She reports that she drinks alcohol. She reports that she does not use illicit drugs.  Family History  Problem Relation Age of Onset  . Cancer Mother     pancreatic     Medications: Patient's Medications  New Prescriptions   No medications on file  Previous Medications   ACETAMINOPHEN (TYLENOL) 325 MG TABLET    Take 650 mg by mouth. Take 2 every 6 hours as needed for pain   ASPIRIN 81 MG CHEWABLE TABLET    Chew by mouth daily.   BIOTIN 1000 MCG TABLET    Take  1,000 mcg by mouth. Take one tablet each moring   CALCIUM CARBONATE-VITAMIN D 600-400 MG-UNIT PER TABLET    Take 1 tablet by mouth daily.   CEFUROXIME (CEFTIN) 250 MG TABLET    Take 250 mg by mouth. Take one tablet each morning   CHOLECALCIFEROL (VITAMIN D3) 2000 UNITS TABS    Take by mouth. Take one each morning   DONEPEZIL (ARICEPT) 10 MG TABLET    Take 10 mg by mouth at bedtime. For memory   FLUTICASONE (FLONASE) 50 MCG/ACT NASAL SPRAY    Place 1 spray into both nostrils. Twice daily   FOLIC ACID (FOLVITE) 800 MCG TABLET    Take 400 mcg by mouth daily.   HYDROCORTISONE 1 % LOTION    Apply 1 application topically. Apply to face as needed   IBANDRONATE  (BONIVA) 150 MG TABLET    Take 150 mg by mouth every 30 (thirty) days. Take in the morning with a full glass of water, on an empty stomach, and do not take anything else by mouth or lie down for the next 30 min.   IBUPROFEN (ADVIL,MOTRIN) 800 MG TABLET    Take 800 mg by mouth every 8 (eight) hours as needed. pain   LEVOTHYROXINE (SYNTHROID, LEVOTHROID) 75 MCG TABLET    Take 75 mcg by mouth daily before breakfast.   LUTEIN 20 MG CAPS    Take by mouth. Take one tablet each morning   MAGNESIUM OXIDE (MAG-OX) 400 MG TABLET    Take 400 mg by mouth daily. For constipation   MEMANTINE (NAMENDA) 10 MG TABLET    Take 10 mg by mouth 2 (two) times daily.   OMEPRAZOLE (PRILOSEC) 20 MG CAPSULE    Take 20 mg by mouth. Take one capsule once a morning *DO NOT CRUSH* TAKE ON EMPTY STOMACH*   PRAMIPEXOLE (MIRAPEX) 0.25 MG TABLET    Take 0.25 mg by mouth. Take one tablet at bedtime   PROBIOTIC PRODUCT (ALIGN) 4 MG CAPS    Take 4 mg by mouth. Take one each morning   SERTRALINE (ZOLOFT) 50 MG TABLET    Take 50 mg by mouth daily.  Modified Medications   No medications on file  Discontinued Medications   No medications on file     Physical Exam: Filed Vitals:   04/18/15 1351  BP: 128/72  Pulse: 80  Temp: 97.7 F (36.5 C)  TempSrc: Oral  Weight: 200 lb (90.719 kg)    Physical Exam  Constitutional: She is oriented to person, place, and time. She appears well-developed and well-nourished. No distress.  HENT:  Head: Normocephalic and atraumatic.  Mouth/Throat: Oropharynx is clear and moist. No oropharyngeal exudate.  Eyes: Conjunctivae are normal. Pupils are equal, round, and reactive to light.  Neck: Normal range of motion. Neck supple.  Cardiovascular: Normal rate, regular rhythm and normal heart sounds.   Pulmonary/Chest: Effort normal and breath sounds normal.  Abdominal: Soft. Bowel sounds are normal.  Musculoskeletal: She exhibits no edema.  Neurological: She is alert and oriented to person, place,  and time.  Skin: Skin is warm and dry. She is not diaphoretic.  Psychiatric: She has a normal mood and affect. Her speech is normal and behavior is normal. Cognition and memory are impaired. She exhibits abnormal recent memory.    Labs reviewed: Basic Metabolic Panel: No results for input(s): NA, K, CL, CO2, GLUCOSE, BUN, CREATININE, CALCIUM, MG, PHOS in the last 8760 hours. Liver Function Tests: No results for input(s): AST, ALT, ALKPHOS, BILITOT,  PROT, ALBUMIN in the last 8760 hours. No results for input(s): LIPASE, AMYLASE in the last 8760 hours. No results for input(s): AMMONIA in the last 8760 hours. CBC: No results for input(s): WBC, NEUTROABS, HGB, HCT, MCV, PLT in the last 8760 hours. TSH:  Recent Labs  04/17/15  TSH 4.23   A1C: No results found for: HGBA1C Lipid Panel: No results for input(s): CHOL, HDL, LDLCALC, TRIG, CHOLHDL, LDLDIRECT in the last 8760 hours.  Assessment/Plan  1. Hypothyroidism, unspecified hypothyroidism type Recent TSH at goal, conts on synthroid 75 mcg  2. Dementia without behavioral disturbance Moderate memory loss, no acute changes.   3. Osteoarthritis, unspecified osteoarthritis type, unspecified site -multiple injuries from falls affecting multiple joints, however pt reports no pain at this time, daughter does report increased weakness that has progressed over the last few months.   4. Vitamin D deficiency -conts on vit d supplement daily   5. Constipation, unspecified constipation type -well managed with probiotics   6. Sprain of ankle, right, subsequent encounter Improved, has completed PT, without pain  Follow up in 3 months with Dr Renato Gailseed

## 2015-05-07 ENCOUNTER — Ambulatory Visit: Payer: Self-pay | Admitting: Pharmacotherapy

## 2015-05-23 ENCOUNTER — Other Ambulatory Visit: Payer: Self-pay | Admitting: Internal Medicine

## 2015-05-23 ENCOUNTER — Encounter: Payer: Self-pay | Admitting: Internal Medicine

## 2015-05-23 ENCOUNTER — Non-Acute Institutional Stay: Payer: Medicare Other | Admitting: Internal Medicine

## 2015-05-23 ENCOUNTER — Ambulatory Visit
Admission: RE | Admit: 2015-05-23 | Discharge: 2015-05-23 | Disposition: A | Discharge status: Home / Self Care | Payer: Medicare Other | Source: Ambulatory Visit | Attending: Internal Medicine | Admitting: Internal Medicine

## 2015-05-23 VITALS — BP 120/72 | HR 64 | Temp 97.7°F

## 2015-05-23 DIAGNOSIS — F0390 Unspecified dementia without behavioral disturbance: Secondary | ICD-10-CM | POA: Diagnosis not present

## 2015-05-23 DIAGNOSIS — E039 Hypothyroidism, unspecified: Secondary | ICD-10-CM

## 2015-05-23 DIAGNOSIS — R296 Repeated falls: Secondary | ICD-10-CM | POA: Diagnosis not present

## 2015-05-23 DIAGNOSIS — M21371 Foot drop, right foot: Secondary | ICD-10-CM

## 2015-05-23 DIAGNOSIS — Z9181 History of falling: Secondary | ICD-10-CM

## 2015-05-23 DIAGNOSIS — F05 Delirium due to known physiological condition: Secondary | ICD-10-CM | POA: Diagnosis not present

## 2015-05-23 DIAGNOSIS — R410 Disorientation, unspecified: Secondary | ICD-10-CM

## 2015-05-23 NOTE — Progress Notes (Signed)
Patient ID: Ellen Hughes, female   DOB: 05/26/1921, 81 y.o.   MRN: [id number]   Location:  [facility]  Code Status: DNR  Goals of Care:Advanced Directive information Does patient have an advance directive?: Yes, Type of Advance Directive: Healthcare Power of [place];Living will;Out of facility DNR (pink MOST or yellow form), Pre-existing out of facility DNR order (yellow form or pink MOST form): Yellow form placed in chart (order not valid for inpatient use), Does patient want to make changes to advanced directive?: No - Patient declined  Chief Complaint  Patient presents with  . Acute Visit    decrease in cognition, unable to drive her power wheelchair, can't function as she use to. Yesterday while working with PT got weak and almost fell. Has had increase decline since her fall June 1st she fell backwards hit head per daughter [name]    HPI: Patient is a 81 y.o. female seen in the [facility] today for an acute visit due to decline since 6/1 fall when got weak and fell backwards hitting her head.    Everything was doing well 5/18.  5/30, got a call from nurse manager that pt was having difficulty standing at the sink.  Then fell on 6/1 backwards as above.  Pt's son came to visit last week and she was getting into her motorized chair to eat in main dining room.  She was having difficulty using the power chair which was new.  Cognitively difficult to use it.    She says she feels fine.  Did not remember the problem working with therapy.  Pt says she has no concerns.  Admits to sometimes feeling weak--says she is now.  Eating and drinking well.  No difficulty swallowing.  Last night, [name] noted her speech was slurred on the phone.  Seems better this am.  Has been having some little headaches since her fall.  Had a few days where she was "dead weight" and needed a lot of help for transfers.  OT noted a lot of differences in her issues using motorized chair and fall risk. Power wheel  chair license has been revoked now.  ST is also working with her and PT.  Has right knee OA and also right foot drop which is chronic.  She has refused to use the orthopedic brace.  Has had bad compression in her lower spine.    Used to see Dr. Thad Ranger at [facility].   No urinary symptoms and urine was already dipped yesterday by staff and negative for markers of infection.    Review of Systems:  Review of Systems  Constitutional: Negative for fever, chills, weight loss and malaise/fatigue.  HENT: Negative for hearing loss.   Eyes: Negative for blurred vision.  Respiratory: Negative for shortness of breath.   Cardiovascular: Negative for chest pain and leg swelling.  Gastrointestinal: Negative for abdominal pain, constipation, blood in stool and melena.  Genitourinary: Negative for dysuria, urgency and frequency.  Musculoskeletal: Positive for joint pain and falls.  Neurological: Positive for speech change, weakness and headaches. Negative for dizziness, tingling, tremors, sensory change, focal weakness, seizures and loss of consciousness.       Transient  Psychiatric/Behavioral: Positive for memory loss.    Past Medical History  Diagnosis Date  . Irregular sleep-wake rhythm, nonorganic origin   . Hypothyroidism   . Vitamin B 12 deficiency   . Hereditary and idiopathic neuropathy   . Gastroesophageal reflux disease without esophagitis   . Osteoarthritis   .  Abnormality of gait and mobility   . Osteoporosis   . Depression   . Vertigo     Past Surgical History  Procedure Laterality Date  . Cosmetic surgery    . Cholecystectomy    . T12 vertebroplasty    . Orif nonunion humerus fracture Left     OP1  . Allograft application  2008    Social History:   reports that she has never smoked. She has never used smokeless tobacco. She reports that she drinks alcohol. She reports that she does not use illicit drugs.  Allergies  Allergen Reactions  . Codeine   .  Morphine And Related     Medications: Patient's Medications  New Prescriptions   No medications on file  Previous Medications   ACETAMINOPHEN (TYLENOL) 325 MG TABLET    Take 650 mg by mouth. Take 2 every 6 hours as needed for pain   ASPIRIN 81 MG CHEWABLE TABLET    Chew by mouth daily.   BIOTIN 1000 MCG TABLET    Take 1,000 mcg by mouth. Take one tablet each moring   CALCIUM CARBONATE-VITAMIN D 600-400 MG-UNIT PER TABLET    Take 1 tablet by mouth daily.   CEFUROXIME (CEFTIN) 250 MG TABLET    Take 250 mg by mouth. Take one tablet each morning   CHOLECALCIFEROL (VITAMIN D3) 2000 UNITS TABS    Take by mouth. Take one each morning   DONEPEZIL (ARICEPT) 10 MG TABLET    Take 10 mg by mouth at bedtime. For memory   FLUTICASONE (FLONASE) 50 MCG/ACT NASAL SPRAY    Place 1 spray into both nostrils. Twice daily   FOLIC ACID (FOLVITE) 800 MCG TABLET    Take 400 mcg by mouth daily.   IBANDRONATE (BONIVA) 150 MG TABLET    Take 150 mg by mouth every 30 (thirty) days. Take in the morning with a full glass of water, on an empty stomach, and do not take anything else by mouth or lie down for the next 30 min.   LEVOTHYROXINE (SYNTHROID, LEVOTHROID) 75 MCG TABLET    Take 75 mcg by mouth daily before breakfast.   LUTEIN 20 MG CAPS    Take by mouth. Take one tablet each morning   MAGNESIUM OXIDE (MAG-OX) 400 MG TABLET    Take 400 mg by mouth daily. For constipation   MEMANTINE (NAMENDA) 10 MG TABLET    Take 10 mg by mouth 2 (two) times daily.   OMEPRAZOLE (PRILOSEC) 20 MG CAPSULE    Take 20 mg by mouth. Take one capsule once a morning *DO NOT CRUSH* TAKE ON EMPTY STOMACH*   PRAMIPEXOLE (MIRAPEX) 0.25 MG TABLET    Take 0.25 mg by mouth. Take one tablet at bedtime   PROBIOTIC PRODUCT (ALIGN) 4 MG CAPS    Take 4 mg by mouth. Take one each morning   SERTRALINE (ZOLOFT) 50 MG TABLET    Take 50 mg by mouth daily.  Modified Medications   No medications on file  Discontinued Medications   HYDROCORTISONE 1 % LOTION     Apply 1 application topically. Apply to face as needed   IBUPROFEN (ADVIL,MOTRIN) 800 MG TABLET    Take 800 mg by mouth every 8 (eight) hours as needed. pain     Physical Exam: Filed Vitals:   05/23/15 1013  BP: 120/72  Pulse: 64  Temp: 97.7 F (36.5 C)  TempSrc: Oral  SpO2: 94%   There is no height or weight on file to  calculate BMI. Physical Exam  Constitutional: She is oriented to person, place, and time. She appears well-developed and well-nourished. No distress.  HENT:  Head: Normocephalic and atraumatic.  Eyes: Conjunctivae and EOM are normal. Pupils are equal, round, and reactive to light.  Neck: Neck supple. No JVD present.  Cardiovascular: Normal rate, regular rhythm, normal heart sounds and intact distal pulses.   Pulmonary/Chest: Effort normal and breath sounds normal. No respiratory distress.  Abdominal: Soft. Bowel sounds are normal. She exhibits no distension. There is no tenderness.  Musculoskeletal: Normal range of motion.  Walks with rollator walker  Lymphadenopathy:    She has no cervical adenopathy.  Neurological: She is alert and oriented to person, place, and time. She displays normal reflexes. No cranial nerve deficit. Coordination normal.  Right foot drop (mild)  Skin: Skin is warm and dry.  Psychiatric: She has a normal mood and affect.     Labs reviewed: Basic Metabolic Panel:  Recent Labs  16/10/96 04/17/15  NA 140  --   K 4.4  --   BUN 24*  --   CREATININE 0.8  --   TSH 11.82* 4.23   Liver Function Tests:  Recent Labs  03/08/15  AST 15  ALT 13  ALKPHOS 77   CBC:  Recent Labs  03/08/15  WBC 7.1  HGB 12.7  HCT 39  PLT 314  Patient Care Team: Kermit Balo, DO as PCP - General (Geriatric Medicine) Sharon Seller, NP as Nurse Practitioner (Nurse Practitioner) Melvenia Needles, MD as Consulting Physician (Ophthalmology) Nadara Mustard, MD as Consulting Physician (Orthopedic Surgery) Donzetta Starch, MD as Consulting Physician  (Dermatology) Thana Farr, MD as Consulting Physician (Neurology)  Assessment/Plan 1. Subacute confusional state -I suspect she had a concussion during her fall earlier this month -she does not have new focal deficits but generally is weaker, having headaches and more confusion/ability to do her executive functions so will check CT brain to r/o acute bleed--may need MRI for more info if not helpful -also already had negative urine dipstick -check cbc, bmp, liver panel, tsh, b12/folate in am -she may need an increased level of care to SNF or some short term rehab also--her motorized chair license has been revoked due to problems she had using it  2. Dementia without behavioral disturbance -seems this is progressing, continues on aricept and namenda for this plus zoloft for depression  3. H/O bad fall -6/1 where she hit her head--did not go out for evaluation -will check CT to r/o bleed from this fall and to see if she's had more small strokes (her daughter notes that prior imaging showed old strokes)  4. Hypothyroidism, unspecified hypothyroidism type -cont current synthroid and f/u tsh  5. Right foot drop -has been chronic--?from prior stroke or back problem (prior compression fxs)  Labs/tests ordered: cbc, bmp, liver panel, b12/folate and tsh  Next appt:  1 month to f/u on weakness, confusion  Othmar Ringer L. Daryn Hicks, D.O. Geriatrics [facility] [address], [place] [address] Cell Phone (Mon-Fri 8am-5pm):  [phone] On Call:  [phone] & follow prompts after 5pm & weekends Office Phone:  [phone] Office Fax:  [phone]

## 2015-05-24 LAB — BASIC METABOLIC PANEL
BUN: 15 mg/dL (ref 4–21)
Creatinine: 0.7 mg/dL (ref 0.5–1.1)
Glucose: 107 mg/dL
Potassium: 4.6 mmol/L (ref 3.4–5.3)
SODIUM: 138 mmol/L (ref 137–147)

## 2015-05-24 LAB — CBC AND DIFFERENTIAL
HEMATOCRIT: 37 % (ref 36–46)
Hemoglobin: 12.3 g/dL (ref 12.0–16.0)
PLATELETS: 291 10*3/uL (ref 150–399)
WBC: 6.2 10^3/mL

## 2015-05-24 LAB — TSH: TSH: 2.63 u[IU]/mL (ref 0.41–5.90)

## 2015-05-24 LAB — HEPATIC FUNCTION PANEL
ALT: 15 U/L (ref 7–35)
AST: 13 U/L (ref 13–35)
Alkaline Phosphatase: 72 U/L (ref 25–125)
Bilirubin, Total: 0.4 mg/dL

## 2015-06-14 ENCOUNTER — Encounter: Payer: Self-pay | Admitting: Adult Health

## 2015-06-14 ENCOUNTER — Non-Acute Institutional Stay (SKILLED_NURSING_FACILITY): Payer: Medicare Other | Admitting: Adult Health

## 2015-06-14 DIAGNOSIS — F329 Major depressive disorder, single episode, unspecified: Secondary | ICD-10-CM | POA: Diagnosis not present

## 2015-06-14 DIAGNOSIS — F0390 Unspecified dementia without behavioral disturbance: Secondary | ICD-10-CM

## 2015-06-14 DIAGNOSIS — R0789 Other chest pain: Secondary | ICD-10-CM

## 2015-06-14 DIAGNOSIS — F32A Depression, unspecified: Secondary | ICD-10-CM | POA: Insufficient documentation

## 2015-06-14 NOTE — Progress Notes (Signed)
Patient ID: Ellen Hughes, female   DOB: [birth year], 81 y.o.   MRN: [id number]   Location:  [facility]  Code Status: DNR  Goals of Care:Advanced Directive information Type of Advance Directive: Healthcare Power of [place];Living will;Out of facility DNR (pink MOST or yellow form), Pre-existing out of facility DNR order (yellow form or pink MOST form): Physician notified to receive inpatient order;Yellow form placed in chart (order not valid for inpatient use)  Chief Complaint  Patient presents with  . Acute Visit    not feeling well  . Medical Management of Chronic Issues    HPI: Patient is a 81 y.o. female seen in the [facility] today due to reports of not feeling well with some chest discomfort this morning.  I went to see her and she had just been to the beauty shop and was feeling well. Her daughter is with her for the visit.  The resident denied any chest discomfort and SOB.  She reported that she feels fine and had no complaints. She has dementia and is a poor historian but can answer q's.  Her daughter is able to confirm that she is her usual self. She is in rehab awaiting a skilled care bed. She is no longer able to live in assisted living. She continues to use WC but can also walk with a walker with assistance. She fell last month and hit her head. A CT was obtained showing no acute findings.  Her daughter reports that for several years now she has had some wheezing with exertion. She was treated previously by Dr. Ricki Miller for this issue. She was placed on breathing tx per the daughter which did not help. Her overall mentation seems to be labile with periods of lucidity and periods of confusion.     Review of Systems:  Review of Systems  Constitutional: Negative for fever, chills, weight loss, malaise/fatigue and diaphoresis.  HENT: Negative for hearing loss.   Eyes: Negative for blurred vision.  Respiratory: Negative for cough and shortness of breath.   Cardiovascular:  Positive for leg swelling. Negative for chest pain.  Gastrointestinal: Negative for abdominal pain, constipation, blood in stool and melena.  Genitourinary: Negative for dysuria, urgency and frequency.  Musculoskeletal: Positive for joint pain and falls.  Neurological: Positive for headaches. Negative for dizziness, tingling, tremors, sensory change, speech change, focal weakness, seizures, loss of consciousness and weakness.       Transient  Psychiatric/Behavioral: Positive for memory loss.    Past Medical History  Diagnosis Date  . Irregular sleep-wake rhythm, nonorganic origin   . Hypothyroidism   . Vitamin B 12 deficiency   . Hereditary and idiopathic neuropathy   . Gastroesophageal reflux disease without esophagitis   . Osteoarthritis   . Abnormality of gait and mobility   . Osteoporosis   . Depression   . Vertigo     Past Surgical History  Procedure Laterality Date  . Cosmetic surgery    . Cholecystectomy    . T12 vertebroplasty    . Orif nonunion humerus fracture Left     OP1  . Allograft application  2008    Social History:   reports that she has never smoked. She has never used smokeless tobacco. She reports that she drinks alcohol. She reports that she does not use illicit drugs.  Allergies  Allergen Reactions  . Codeine   . Morphine And Related     Medications: Patient's Medications  New Prescriptions   No medications on  file  Previous Medications   ACETAMINOPHEN (TYLENOL) 325 MG TABLET    Take 650 mg by mouth. Take 2 every 6 hours as needed for pain   ASPIRIN 81 MG CHEWABLE TABLET    Chew by mouth daily.   BIOTIN 1000 MCG TABLET    Take 1,000 mcg by mouth. Take one tablet each moring   CALCIUM CARBONATE-VITAMIN D 600-400 MG-UNIT PER TABLET    Take 1 tablet by mouth daily.   CEFUROXIME (CEFTIN) 250 MG TABLET    Take 250 mg by mouth. Take one tablet each morning   CHOLECALCIFEROL (VITAMIN D3) 2000 UNITS TABS    Take by mouth. Take one each morning    DONEPEZIL (ARICEPT) 10 MG TABLET    Take 10 mg by mouth at bedtime. For memory   FLUTICASONE (FLONASE) 50 MCG/ACT NASAL SPRAY    Place 1 spray into both nostrils. Twice daily   FOLIC ACID (FOLVITE) 800 MCG TABLET    Take 400 mcg by mouth daily.   IBANDRONATE (BONIVA) 150 MG TABLET    Take 150 mg by mouth every 30 (thirty) days. Take in the morning with a full glass of water, on an empty stomach, and do not take anything else by mouth or lie down for the next 30 min.   LEVOTHYROXINE (SYNTHROID, LEVOTHROID) 75 MCG TABLET    Take 75 mcg by mouth daily before breakfast.   LUTEIN 20 MG CAPS    Take by mouth. Take one tablet each morning   MAGNESIUM OXIDE (MAG-OX) 400 MG TABLET    Take 400 mg by mouth daily. For constipation   MEMANTINE (NAMENDA) 10 MG TABLET    Take 10 mg by mouth 2 (two) times daily.   OMEPRAZOLE (PRILOSEC) 20 MG CAPSULE    Take 20 mg by mouth. Take one capsule once a morning *DO NOT CRUSH* TAKE ON EMPTY STOMACH*   PRAMIPEXOLE (MIRAPEX) 0.25 MG TABLET    Take 0.25 mg by mouth. Take one tablet at bedtime   PROBIOTIC PRODUCT (ALIGN) 4 MG CAPS    Take 4 mg by mouth. Take one each morning   SERTRALINE (ZOLOFT) 50 MG TABLET    Take 50 mg by mouth daily.   VITAMIN B-12 (CYANOCOBALAMIN) 1000 MCG TABLET    Take 2,000 mcg by mouth daily.  Modified Medications   No medications on file  Discontinued Medications   No medications on file     Physical Exam: Filed Vitals:   06/14/15 1614  BP: 157/87  Pulse: 76  Temp: 97.3 F (36.3 C)  Resp: 16  Weight: 204 lb (92.534 kg)  SpO2: 92%   There is no height on file to calculate BMI. Physical Exam  Constitutional: She is oriented to person, place, and time. She appears well-developed and well-nourished. No distress.  HENT:  Head: Normocephalic and atraumatic.  Eyes: Conjunctivae and EOM are normal. Pupils are equal, round, and reactive to light.  Neck: Neck supple. No JVD present.  Cardiovascular: Normal rate, regular rhythm, normal  heart sounds and intact distal pulses.   Edema +1 to BLE  Pulmonary/Chest: Effort normal and breath sounds normal. No respiratory distress.  Abdominal: Soft. Bowel sounds are normal. She exhibits no distension. There is no tenderness.  Musculoskeletal: Normal range of motion.  Walks with rollator walker  Lymphadenopathy:    She has no cervical adenopathy.  Neurological: She is alert and oriented to person, place, and time. She displays normal reflexes. No cranial nerve deficit. Coordination normal.  Right foot drop (mild)  Skin: Skin is warm and dry.  Psychiatric: She has a normal mood and affect.     Labs reviewed: Basic Metabolic Panel:  Recent Labs  81/19/1403/06/16 04/17/15 05/24/15  NA 140  --  138  K 4.4  --  4.6  BUN 24*  --  15  CREATININE 0.8  --  0.7  TSH 11.82* 4.23 2.63   Liver Function Tests:  Recent Labs  03/08/15 05/24/15  AST 15 13  ALT 13 15  ALKPHOS 77 72   CBC:  Recent Labs  03/08/15 05/24/15  WBC 7.1 6.2  HGB 12.7 12.3  HCT 39 37  PLT 314 291  Patient Care Team: Kermit Baloiffany L Reed, DO as PCP - General (Geriatric Medicine) Sharon SellerJessica K Eubanks, NP as Nurse Practitioner (Nurse Practitioner) Melvenia NeedlesLeon Cashwell, MD as Consulting Physician (Ophthalmology) Nadara MustardMarcus Duda V, MD as Consulting Physician (Orthopedic Surgery) Donzetta Starchrew Jones, MD as Consulting Physician (Dermatology) Thana FarrLeslie Reynolds, MD as Consulting Physician (Neurology)  Assessment/Plan  1) Chest discomfort -resolved, no further work up indicated at this time -continue to monitor VS and for symptoms of CP -continue prilosec -she is on boniva, if this re occurs we will need to stop it  2) Dementia without behavioral disturbance -lability in cognitive ability ?vascular dementia -continue namenda and aricept  3) Depression -her daughter reports that her mood is pleasant and feels that the zoloft has helped, despite her recent move from AL -continue Zoloft at this time    Peggye Leyhristy Javin Nong, ANP [facility] [phone]

## 2015-06-20 ENCOUNTER — Encounter: Payer: Self-pay | Admitting: Internal Medicine

## 2015-07-18 ENCOUNTER — Encounter: Payer: Self-pay | Admitting: Internal Medicine

## 2015-07-26 ENCOUNTER — Non-Acute Institutional Stay (SKILLED_NURSING_FACILITY): Payer: Medicare Other | Admitting: Adult Health

## 2015-07-26 ENCOUNTER — Encounter: Payer: Self-pay | Admitting: Adult Health

## 2015-07-26 DIAGNOSIS — M810 Age-related osteoporosis without current pathological fracture: Secondary | ICD-10-CM

## 2015-07-26 DIAGNOSIS — R269 Unspecified abnormalities of gait and mobility: Secondary | ICD-10-CM | POA: Diagnosis not present

## 2015-07-26 DIAGNOSIS — R062 Wheezing: Secondary | ICD-10-CM | POA: Insufficient documentation

## 2015-07-26 DIAGNOSIS — N390 Urinary tract infection, site not specified: Secondary | ICD-10-CM | POA: Diagnosis not present

## 2015-07-26 DIAGNOSIS — L989 Disorder of the skin and subcutaneous tissue, unspecified: Secondary | ICD-10-CM

## 2015-07-26 DIAGNOSIS — E559 Vitamin D deficiency, unspecified: Secondary | ICD-10-CM | POA: Diagnosis not present

## 2015-07-26 DIAGNOSIS — F0390 Unspecified dementia without behavioral disturbance: Secondary | ICD-10-CM

## 2015-07-26 DIAGNOSIS — F329 Major depressive disorder, single episode, unspecified: Secondary | ICD-10-CM

## 2015-07-26 DIAGNOSIS — M21371 Foot drop, right foot: Secondary | ICD-10-CM | POA: Insufficient documentation

## 2015-07-26 DIAGNOSIS — E538 Deficiency of other specified B group vitamins: Secondary | ICD-10-CM | POA: Diagnosis not present

## 2015-07-26 DIAGNOSIS — F32A Depression, unspecified: Secondary | ICD-10-CM

## 2015-07-26 NOTE — Progress Notes (Addendum)
Patient ID: Ellen Hughes, female   DOB: 01-Nov-1921, 81 y.o.   MRN: [id number]     Patient Care Team: Gayland Curry, DO as PCP - General (Geriatric Medicine) Lauree Chandler, NP as Nurse Practitioner (Nurse Practitioner) Prentiss Bells, MD as Consulting Physician (Ophthalmology) Newt Minion, MD as Consulting Physician (Orthopedic Surgery) Jarome Matin, MD as Consulting Physician (Dermatology) Alexis Goodell, MD as Consulting Physician (Neurology)  Nursing Home Location:  [facility]   Code Status: DNR  Place of Service: SNF (31)  Chief Complaint  Patient presents with  . Medical Management of Chronic Issues    HPI:  81 y.o. female residing at [facility], skilled care. I am here to review her chronic medical issues.  She has a hx of progressive dementia that has necessitating moving to rehab.  She has been working with PT/OT/ST.  She has met her goals with PT and is currently on a gait program using a  Walker. The staff reports that she needs to be encouraged to be more mobile. She has been discharged from [facility] but continues to work with OT.  She is awaiting a bed in enhanced AL/skilled. VS have been stable over the past month. Weight has relatively remained stable at 203 lbs.   She complaints of an irritated area to her left chest but can not elaborate more on the issue.   BIMS score 8/15 (06/08/2015), does well with work recall but not oriented to time  Review of Systems:  Review of Systems  Constitutional: Negative for fever, activity change, appetite change and fatigue.  HENT: Negative for congestion.   Respiratory: Negative for cough and shortness of breath.   Cardiovascular: Positive for leg swelling. Negative for chest pain and palpitations.  Gastrointestinal: Negative for abdominal pain and abdominal distention.  Genitourinary: Negative for dysuria and difficulty urinating.  Musculoskeletal: Positive for arthralgias (knees) and gait  problem.  Neurological: Negative for dizziness and speech difficulty.  Psychiatric/Behavioral: Positive for confusion. Negative for behavioral problems and agitation.    Medications: Patient's Medications  New Prescriptions   No medications on file  Previous Medications   ACETAMINOPHEN (TYLENOL) 325 MG TABLET    Take 650 mg by mouth. Take 2 every 6 hours as needed for pain   ASPIRIN 81 MG CHEWABLE TABLET    Chew by mouth daily.   BIOTIN 1000 MCG TABLET    Take 1,000 mcg by mouth. Take one tablet each moring   CALCIUM CARBONATE-VITAMIN D 600-400 MG-UNIT PER TABLET    Take 1 tablet by mouth daily.   CEFUROXIME (CEFTIN) 250 MG TABLET    Take 250 mg by mouth. Take one tablet each morning   CHOLECALCIFEROL (VITAMIN D3) 2000 UNITS TABS    Take by mouth. Take one each morning   DONEPEZIL (ARICEPT) 10 MG TABLET    Take 10 mg by mouth at bedtime. For memory   FLUTICASONE (FLONASE) 50 MCG/ACT NASAL SPRAY    Place 1 spray into both nostrils. Twice daily   FOLIC ACID (FOLVITE) 169 MCG TABLET    Take 800 mcg by mouth daily.    IBANDRONATE (BONIVA) 150 MG TABLET    Take 150 mg by mouth every 30 (thirty) days. Take in the morning with a full glass of water, on an empty stomach, and do not take anything else by mouth or lie down for the next 30 min.   LEVOTHYROXINE (SYNTHROID, LEVOTHROID) 75 MCG TABLET    Take 75 mcg by mouth daily  before breakfast.   LUTEIN 20 MG CAPS    Take by mouth. Take one tablet each morning   MAGNESIUM OXIDE (MAG-OX) 400 MG TABLET    Take 400 mg by mouth daily. For constipation   MEMANTINE (NAMENDA) 10 MG TABLET    Take 10 mg by mouth 2 (two) times daily.   OMEPRAZOLE (PRILOSEC) 20 MG CAPSULE    Take 20 mg by mouth. Take one capsule once a morning *DO NOT CRUSH* TAKE ON EMPTY STOMACH*   PRAMIPEXOLE (MIRAPEX) 0.25 MG TABLET    Take 0.25 mg by mouth. Take one tablet at bedtime   PROBIOTIC PRODUCT (ALIGN) 4 MG CAPS    Take 4 mg by mouth. Take one each morning   SERTRALINE (ZOLOFT) 50  MG TABLET    Take 50 mg by mouth daily.   VITAMIN B-12 (CYANOCOBALAMIN) 1000 MCG TABLET    Take 2,000 mcg by mouth daily.  Modified Medications   No medications on file  Discontinued Medications   No medications on file     Physical Exam:  Filed Vitals:   07/26/15 1027  BP: 151/83  Pulse: 75  Temp: 97.4 F (36.3 C)  Resp: 18  Weight: 203 lb 12.8 oz (92.443 kg)  SpO2: 92%   Wt Readings from Last 3 Encounters:  07/26/15 203 lb 12.8 oz (92.443 kg)  06/14/15 204 lb (92.534 kg)  04/18/15 200 lb (90.719 kg)    Physical Exam  Constitutional: No distress.  overweight  Neck: Normal range of motion. Neck supple. No JVD present. No tracheal deviation present. No thyromegaly present.  Cardiovascular: Normal rate and regular rhythm.   No murmur heard. bilat trace edema  Pulmonary/Chest: Effort normal and breath sounds normal. No respiratory distress. She has no wheezes.  Abdominal: Soft. Bowel sounds are normal. She exhibits no distension. There is no tenderness.  rotund  Lymphadenopathy:    She has no cervical adenopathy.  Neurological: She is alert.  Oriented x 2, able to f/c, MAE  Skin: Skin is warm and dry. She is not diaphoretic.  One lesion to the left chest with scaly appearance and surrounding erythema.  To the left of this lesion is another that is raised, round, symmetric, with surrounding erythema  Psychiatric: Affect normal.    Labs reviewed/Significant Diagnostic Results:  Basic Metabolic Panel:  Recent Labs  03/08/15 05/24/15  NA 140 138  K 4.4 4.6  BUN 24* 15  CREATININE 0.8 0.7   Liver Function Tests:  Recent Labs  03/08/15 05/24/15  AST 15 13  ALT 13 15  ALKPHOS 77 72   No results for input(s): LIPASE, AMYLASE in the last 8760 hours. No results for input(s): AMMONIA in the last 8760 hours. CBC:  Recent Labs  03/08/15 05/24/15  WBC 7.1 6.2  HGB 12.7 12.3  HCT 39 37  PLT 314 291   CBG: No results for input(s): GLUCAP in the last 8760  hours. TSH:  Recent Labs  03/08/15 04/17/15 05/24/15  TSH 11.82* 4.23 2.63   A1C: No results found for: HGBA1C Lipid Panel: No results for input(s): CHOL, HDL, LDLCALC, TRIG, CHOLHDL, LDLDIRECT in the last 8760 hours.     Assessment/Plan  1. B12 deficiency -currently on supplementation, level due next month  2. Osteoporosis -continue Boniva  3. Dementia without behavioral disturbance -mixed AD and vascular? (H/o remote CVA on MRI) -stable on aricept and namenda, awaiting enhanced AL/skilled  bed -continue OT as indicated  4. Depression -no s/s depression, continue zoloft  due to recent changes in behavior, necessitating move  5. Chronic UTI -no symptoms, continue ceftin  6. Abnormality of gait (h/o right foot drop) -continue to encourage ambulation with walker per walking program -completed PT -fall prec  7. Skin lesion -noted to left chest, f/u with derm  Overall transitioning well in rehab but continues to need encouragement per staff to remain mobile. Will most likely do well in enhanced AL/vs. Skilled but will await OT input.    Cindi Carbon, ANP [facility] [phone]

## 2015-09-10 ENCOUNTER — Non-Acute Institutional Stay (SKILLED_NURSING_FACILITY): Payer: Medicare Other | Admitting: Adult Health

## 2015-09-10 ENCOUNTER — Encounter: Payer: Self-pay | Admitting: Adult Health

## 2015-09-10 DIAGNOSIS — F329 Major depressive disorder, single episode, unspecified: Secondary | ICD-10-CM

## 2015-09-10 DIAGNOSIS — M1990 Unspecified osteoarthritis, unspecified site: Secondary | ICD-10-CM

## 2015-09-10 DIAGNOSIS — N390 Urinary tract infection, site not specified: Secondary | ICD-10-CM | POA: Diagnosis not present

## 2015-09-10 DIAGNOSIS — E538 Deficiency of other specified B group vitamins: Secondary | ICD-10-CM | POA: Diagnosis not present

## 2015-09-10 DIAGNOSIS — F32A Depression, unspecified: Secondary | ICD-10-CM

## 2015-09-10 NOTE — Progress Notes (Signed)
Patient ID: Ellen Hughes, female   DOB: [birth year], 81 y.o.   MRN: [id number]     Patient Care Team: Kermit Balo, DO as PCP - General (Geriatric Medicine) Sharon Seller, NP as Nurse Practitioner (Nurse Practitioner) Melvenia Needles, MD as Consulting Physician (Ophthalmology) Nadara Mustard, MD as Consulting Physician (Orthopedic Surgery) Donzetta Starch, MD as Consulting Physician (Dermatology) Thana Farr, MD as Consulting Physician (Neurology)  Nursing Home Location:  [facility]   Code Status: DNR  Place of Service: SNF (31)  Chief Complaint  Patient presents with  . Medical Management of Chronic Issues    HPI:  81 y.o. female residing at [facility], skilled care. I am here to review her chronic medical issues.  She has a hx of dementia, B12 deficiency, hypothyroidism, OA, OP, chronic UTI, and falls. She has recently moved to skilled care and states that she is happy with the transition.  She denies any complaints today. She was seen by ortho in Sept and received a depomedrol injection to the right knee for OA. She can not tell if it has helped. She is a poor historian due to dementia but can answer q's and follow commands.   Review of Systems:  Review of Systems  Constitutional: Negative for fever, activity change, appetite change and fatigue.  HENT: Negative for congestion.   Respiratory: Negative for cough and shortness of breath.   Cardiovascular: Positive for leg swelling. Negative for chest pain and palpitations.  Gastrointestinal: Negative for abdominal pain and abdominal distention.  Genitourinary: Negative for dysuria and difficulty urinating.  Musculoskeletal: Positive for arthralgias (knees) and gait problem.  Neurological: Negative for dizziness and speech difficulty.  Psychiatric/Behavioral: Positive for confusion. Negative for behavioral problems and agitation.    Medications: Patient's Medications  New Prescriptions     No medications on file  Previous Medications   ACETAMINOPHEN (TYLENOL) 325 MG TABLET    Take 650 mg by mouth. Take 2 every 6 hours as needed for pain   ASPIRIN 81 MG CHEWABLE TABLET    Chew by mouth daily.   BIOTIN 1000 MCG TABLET    Take 1,000 mcg by mouth. Take one tablet each moring   CALCIUM CARBONATE-VITAMIN D 600-400 MG-UNIT PER TABLET    Take 1 tablet by mouth daily.   CEFUROXIME (CEFTIN) 250 MG TABLET    Take 250 mg by mouth. Take one tablet each morning   CHOLECALCIFEROL (VITAMIN D3) 2000 UNITS TABS    Take by mouth. Take one each morning   DONEPEZIL (ARICEPT) 10 MG TABLET    Take 10 mg by mouth at bedtime. For memory   FLUTICASONE (FLONASE) 50 MCG/ACT NASAL SPRAY    Place 1 spray into both nostrils. Twice daily   FOLIC ACID (FOLVITE) 800 MCG TABLET    Take 800 mcg by mouth daily.    IBANDRONATE (BONIVA) 150 MG TABLET    Take 150 mg by mouth every 30 (thirty) days. Take in the morning with a full glass of water, on an empty stomach, and do not take anything else by mouth or lie down for the next 30 min.   LEVOTHYROXINE (SYNTHROID, LEVOTHROID) 75 MCG TABLET    Take 75 mcg by mouth daily before breakfast.   LUTEIN 20 MG CAPS    Take by mouth. Take one tablet each morning   MAGNESIUM OXIDE (MAG-OX) 400 MG TABLET    Take 400 mg by mouth daily. For constipation   MEMANTINE (NAMENDA) 10 MG TABLET  Take 10 mg by mouth 2 (two) times daily.   OMEPRAZOLE (PRILOSEC) 20 MG CAPSULE    Take 20 mg by mouth. Take one capsule once a morning *DO NOT CRUSH* TAKE ON EMPTY STOMACH*   PRAMIPEXOLE (MIRAPEX) 0.25 MG TABLET    Take 0.25 mg by mouth. Take one tablet at bedtime   PROBIOTIC PRODUCT (ALIGN) 4 MG CAPS    Take 4 mg by mouth. Take one each morning   SERTRALINE (ZOLOFT) 50 MG TABLET    Take 50 mg by mouth daily.   VITAMIN B-12 (CYANOCOBALAMIN) 1000 MCG TABLET    Take 1,000 mcg by mouth daily.   Modified Medications   No medications on file  Discontinued Medications   No medications on file      Physical Exam:  Filed Vitals:   09/10/15 1602  BP: 166/97  Pulse: 73  Temp: 97.2 F (36.2 C)  Resp: 18  Weight: 199 lb 12.8 oz (90.629 kg)   Wt Readings from Last 3 Encounters:  09/10/15 199 lb 12.8 oz (90.629 kg)  07/26/15 203 lb 12.8 oz (92.443 kg)  06/14/15 204 lb (92.534 kg)    Physical Exam  Constitutional: No distress.  overweight  Neck: Normal range of motion. Neck supple. No JVD present. No tracheal deviation present. No thyromegaly present.  Cardiovascular: Normal rate and regular rhythm.   No murmur heard. bilat trace edema  Pulmonary/Chest: Effort normal and breath sounds normal. No respiratory distress. She has no wheezes.  Abdominal: Soft. Bowel sounds are normal. She exhibits no distension. There is no tenderness.  rotund  Lymphadenopathy:    She has no cervical adenopathy.  Neurological: She is alert.  Oriented x 2, able to f/c, MAE  Skin: Skin is warm and dry. She is not diaphoretic.  Psychiatric: Affect normal.    Labs reviewed/Significant Diagnostic Results:  Basic Metabolic Panel:  Recent Labs  16/10/96 05/24/15  NA 140 138  K 4.4 4.6  BUN 24* 15  CREATININE 0.8 0.7   Liver Function Tests:  Recent Labs  03/08/15 05/24/15  AST 15 13  ALT 13 15  ALKPHOS 77 72   No results for input(s): LIPASE, AMYLASE in the last 8760 hours. No results for input(s): AMMONIA in the last 8760 hours. CBC:  Recent Labs  03/08/15 05/24/15  WBC 7.1 6.2  HGB 12.7 12.3  HCT 39 37  PLT 314 291   CBG: No results for input(s): GLUCAP in the last 8760 hours. TSH:  Recent Labs  03/08/15 04/17/15 05/24/15  TSH 11.82* 4.23 2.63   A1C: No results found for: HGBA1C Lipid Panel: No results for input(s): CHOL, HDL, LDLCALC, TRIG, CHOLHDL, LDLDIRECT in the last 8760 hours.     Assessment/Plan 1. B12 deficiency -supplement decreased to due to high levels, recheck next month  2. Chronic UTI -no new events, continue ceftin   3.  Osteoarthritis, unspecified osteoarthritis type, unspecified site -difficult to assess if the injection helped but no pain was elicited on exam -continue to monitor  4. Dementia without behavioral disturbance -mixed AD and vascular? (H/o remote CVA on MRI) -currently on namenda and tolerating well -obtain MMSE for records  5. Depression -no s/s of depression -continue current med  Peggye Ley, ANP [facility] [phone]

## 2015-10-18 ENCOUNTER — Non-Acute Institutional Stay (SKILLED_NURSING_FACILITY): Payer: Medicare Other | Admitting: Adult Health

## 2015-10-18 DIAGNOSIS — Z8673 Personal history of transient ischemic attack (TIA), and cerebral infarction without residual deficits: Secondary | ICD-10-CM | POA: Diagnosis not present

## 2015-10-18 DIAGNOSIS — F0390 Unspecified dementia without behavioral disturbance: Secondary | ICD-10-CM

## 2015-10-18 DIAGNOSIS — M810 Age-related osteoporosis without current pathological fracture: Secondary | ICD-10-CM | POA: Diagnosis not present

## 2015-10-18 DIAGNOSIS — J309 Allergic rhinitis, unspecified: Secondary | ICD-10-CM | POA: Diagnosis not present

## 2015-10-22 DIAGNOSIS — Z8673 Personal history of transient ischemic attack (TIA), and cerebral infarction without residual deficits: Secondary | ICD-10-CM | POA: Insufficient documentation

## 2015-10-22 DIAGNOSIS — J309 Allergic rhinitis, unspecified: Secondary | ICD-10-CM | POA: Insufficient documentation

## 2015-10-22 NOTE — Progress Notes (Signed)
Patient ID: Ellen Hughes, female   DOB: 07-03-1921, 81 y.o.   MRN: [id number]     Patient Care Team: Kermit Balo, DO as PCP - General (Geriatric Medicine) Sharon Seller, NP as Nurse Practitioner (Nurse Practitioner) Melvenia Needles, MD as Consulting Physician (Ophthalmology) Nadara Mustard, MD as Consulting Physician (Orthopedic Surgery) Donzetta Starch, MD as Consulting Physician (Dermatology) Thana Farr, MD as Consulting Physician (Neurology)  Nursing Home Location:  [facility]   Code Status: DNR  Place of Service: SNF (31)  Chief Complaint  Patient presents with  . Medical Management of Chronic Issues    HPI:  81 y.o. female residing at [facility], skilled care. I am here to review her chronic medical issues.  She has a hx of dementia, B12 deficiency, hypothyroidism, OA, OP, chronic UTI, and falls.  She does not have any complaints for our visit today.    Review of Systems:  Review of Systems  Constitutional: Negative for fever, activity change, appetite change and fatigue.  HENT: Negative for congestion.   Respiratory: Negative for cough and shortness of breath.   Cardiovascular: Positive for leg swelling. Negative for chest pain and palpitations.  Gastrointestinal: Negative for abdominal pain and abdominal distention.  Genitourinary: Negative for dysuria and difficulty urinating.  Musculoskeletal: Positive for arthralgias (knees) and gait problem.  Neurological: Negative for dizziness and speech difficulty.  Psychiatric/Behavioral: Positive for confusion. Negative for behavioral problems and agitation.    Medications: Patient's Medications  New Prescriptions   No medications on file  Previous Medications   ACETAMINOPHEN (TYLENOL) 325 MG TABLET    Take 650 mg by mouth. Take 2 every 6 hours as needed for pain   ASPIRIN 81 MG CHEWABLE TABLET    Chew by mouth daily.   BIOTIN 1000 MCG TABLET    Take 1,000 mcg by mouth. Take one  tablet each moring   CALCIUM CARBONATE-VITAMIN D 600-400 MG-UNIT PER TABLET    Take 1 tablet by mouth daily.   CEFUROXIME (CEFTIN) 250 MG TABLET    Take 250 mg by mouth. Take one tablet each morning   CHOLECALCIFEROL (VITAMIN D3) 2000 UNITS TABS    Take by mouth. Take one each morning   DONEPEZIL (ARICEPT) 10 MG TABLET    Take 10 mg by mouth at bedtime. For memory   FLUTICASONE (FLONASE) 50 MCG/ACT NASAL SPRAY    Place 1 spray into both nostrils. Twice daily   FOLIC ACID (FOLVITE) 800 MCG TABLET    Take 800 mcg by mouth daily.    IBANDRONATE (BONIVA) 150 MG TABLET    Take 150 mg by mouth every 30 (thirty) days. Take in the morning with a full glass of water, on an empty stomach, and do not take anything else by mouth or lie down for the next 30 min.   LEVOTHYROXINE (SYNTHROID, LEVOTHROID) 75 MCG TABLET    Take 75 mcg by mouth daily before breakfast.   LUTEIN 20 MG CAPS    Take by mouth. Take one tablet each morning   MAGNESIUM OXIDE (MAG-OX) 400 MG TABLET    Take 400 mg by mouth daily. For constipation   MEMANTINE (NAMENDA) 10 MG TABLET    Take 10 mg by mouth 2 (two) times daily.   OMEPRAZOLE (PRILOSEC) 20 MG CAPSULE    Take 20 mg by mouth. Take one capsule once a morning *DO NOT CRUSH* TAKE ON EMPTY STOMACH*   PRAMIPEXOLE (MIRAPEX) 0.25 MG TABLET    Take 0.25  mg by mouth. Take one tablet at bedtime   PROBIOTIC PRODUCT (ALIGN) 4 MG CAPS    Take 4 mg by mouth. Take one each morning   SERTRALINE (ZOLOFT) 50 MG TABLET    Take 50 mg by mouth daily.   VITAMIN B-12 (CYANOCOBALAMIN) 1000 MCG TABLET    Take 1,000 mcg by mouth daily.   Modified Medications   No medications on file  Discontinued Medications   No medications on file     Physical Exam:  There were no vitals filed for this visit. Wt Readings from Last 3 Encounters:  09/10/15 199 lb 12.8 oz (90.629 kg)  07/26/15 203 lb 12.8 oz (92.443 kg)  06/14/15 204 lb (92.534 kg)    Physical Exam  Constitutional: No distress.  overweight    Neck: Normal range of motion. Neck supple. No JVD present. No tracheal deviation present. No thyromegaly present.  Cardiovascular: Normal rate and regular rhythm.   No murmur heard. bilat trace edema  Pulmonary/Chest: Effort normal and breath sounds normal. No respiratory distress. She has no wheezes.  Abdominal: Soft. Bowel sounds are normal. She exhibits no distension. There is no tenderness.  rotund  Lymphadenopathy:    She has no cervical adenopathy.  Neurological: She is alert.  Oriented x 2, able to f/c, MAE  Skin: Skin is warm and dry. She is not diaphoretic.  Psychiatric: Affect normal.    Labs reviewed/Significant Diagnostic Results:  Basic Metabolic Panel:  Recent Labs  16/09/9603/07/16 05/24/15  NA 140 138  K 4.4 4.6  BUN 24* 15  CREATININE 0.8 0.7   Liver Function Tests:  Recent Labs  03/08/15 05/24/15  AST 15 13  ALT 13 15  ALKPHOS 77 72   No results for input(s): LIPASE, AMYLASE in the last 8760 hours. No results for input(s): AMMONIA in the last 8760 hours. CBC:  Recent Labs  03/08/15 05/24/15  WBC 7.1 6.2  HGB 12.7 12.3  HCT 39 37  PLT 314 291   CBG: No results for input(s): GLUCAP in the last 8760 hours. TSH:  Recent Labs  03/08/15 04/17/15 05/24/15  TSH 11.82* 4.23 2.63   A1C: No results found for: HGBA1C Lipid Panel: No results for input(s): CHOL, HDL, LDLCALC, TRIG, CHOLHDL, LDLDIRECT in the last 8760 hours.     Assessment/Plan  1. Dementia without behavioral disturbance -adjusted well to skilled to care -stable cognitive and functional status since arrived -continue aricept and namenda  2. Allergic rhinitis, unspecified allergic rhinitis type -denies any congestion or runny nose  -continue flonase  3. H/O: CVA (cerebrovascular accident) -noted on MRI in 2012, currently on aspirin -no new events  4. Osteoporosis -continues on Boniva, which I believe was started in April of 2016 -has a hx of vertebral compression fracture, s/p  vertebroplasty -no recent dexa scan in our system but given the fact that she is non ambulatory and in skilled care I don't see it necessary to do this     Peggye Leyhristy Kaicen Desena, ANP [facility] [phone]

## 2015-11-13 ENCOUNTER — Non-Acute Institutional Stay (SKILLED_NURSING_FACILITY): Payer: Medicare Other | Admitting: Internal Medicine

## 2015-11-13 ENCOUNTER — Encounter: Payer: Self-pay | Admitting: Internal Medicine

## 2015-11-13 DIAGNOSIS — I872 Venous insufficiency (chronic) (peripheral): Secondary | ICD-10-CM | POA: Diagnosis not present

## 2015-11-13 DIAGNOSIS — F0390 Unspecified dementia without behavioral disturbance: Secondary | ICD-10-CM | POA: Diagnosis not present

## 2015-11-13 DIAGNOSIS — J309 Allergic rhinitis, unspecified: Secondary | ICD-10-CM

## 2015-11-13 DIAGNOSIS — R05 Cough: Secondary | ICD-10-CM | POA: Diagnosis not present

## 2015-11-13 DIAGNOSIS — R059 Cough, unspecified: Secondary | ICD-10-CM

## 2015-11-13 NOTE — Progress Notes (Signed)
Patient ID: Ellen Hughes, female   DOB: 10/28/1921, 81 y.o.   MRN: [id number]   Location: [facility] Provider: Danyeal Akens L. Renato Gailseed, D.O., C.M.D.  Code Status: DNR Goals of Care: Advanced Directive information Advanced Directives 11/13/2015  Does patient have an advance directive? Yes  Type of Estate agentAdvance Directive Healthcare Power of VernalAttorney;Out of facility DNR (pink MOST or yellow form)  Does patient want to make changes to advanced directive? -  Copy of advanced directive(s) in chart? Yes  Pre-existing out of facility DNR order (yellow form or pink MOST form) Yellow form placed in chart (order not valid for inpatient use)     Chief Complaint  Patient presents with  . Acute Visit    cough    HPI: Patient is a 81 y.o. female seen in the office today for an acute visit for a cough.  Her son was visiting and reports that he noted she had coughed up a large amt of mucus when he first arrived late last week.  Staff note the cough comes and goes and is usually on the dry side.  Pt does not seem bothered by the symptom or is not remembering she's having it.  She also has some edema of her lower legs which has been chronic.  BP slightly elevated, but has been taking robitussin dm.  Review of Systems:  Review of Systems  Constitutional: Negative for fever, chills and malaise/fatigue.  Respiratory: Positive for cough and sputum production. Negative for shortness of breath and wheezing.   Cardiovascular: Positive for leg swelling. Negative for chest pain, palpitations, orthopnea and PND.  Gastrointestinal: Negative for nausea, vomiting and diarrhea.  Musculoskeletal: Negative for falls.  Skin: Negative for rash.  Neurological: Negative for weakness and headaches.  Psychiatric/Behavioral: Positive for memory loss.    Past Medical History  Diagnosis Date  . Irregular sleep-wake rhythm, nonorganic origin   . Hypothyroidism   . Vitamin B 12 deficiency   . Hereditary and idiopathic neuropathy    . Gastroesophageal reflux disease without esophagitis   . Osteoarthritis   . Abnormality of gait and mobility   . Osteoporosis   . Depression   . Vertigo     Past Surgical History  Procedure Laterality Date  . Cosmetic surgery    . Cholecystectomy    . T12 vertebroplasty    . Orif nonunion humerus fracture Left     OP1  . Allograft application  2008    Allergies  Allergen Reactions  . Codeine   . Morphine And Related       Medication List       This list is accurate as of: 11/13/15 11:59 PM.  Always use your most recent med list.               acetaminophen 325 MG tablet  Commonly known as:  TYLENOL  Take 650 mg by mouth. Take 2 every 6 hours as needed for pain     ALIGN 4 MG Caps  Take 4 mg by mouth. Take one each morning     aspirin 81 MG chewable tablet  Chew by mouth daily.     Biotin 1000 MCG tablet  Take 1,000 mcg by mouth. Take one tablet each moring     Calcium Carbonate-Vitamin D 600-400 MG-UNIT tablet  Take 1 tablet by mouth daily.     cefUROXime 250 MG tablet  Commonly known as:  CEFTIN  Take 250 mg by mouth. Take one tablet each morning  donepezil 10 MG tablet  Commonly known as:  ARICEPT  Take 10 mg by mouth at bedtime. For memory     fluticasone 50 MCG/ACT nasal spray  Commonly known as:  FLONASE  Place 1 spray into both nostrils. Twice daily     folic acid 800 MCG tablet  Commonly known as:  FOLVITE  Take 800 mcg by mouth daily.     ibandronate 150 MG tablet  Commonly known as:  BONIVA  Take 150 mg by mouth every 30 (thirty) days. Take in the morning with a full glass of water, on an empty stomach, and do not take anything else by mouth or lie down for the next 30 min.     levothyroxine 75 MCG tablet  Commonly known as:  SYNTHROID, LEVOTHROID  Take 75 mcg by mouth daily before breakfast.     Lutein 20 MG Caps  Take by mouth. Take one tablet each morning     magnesium oxide 400 MG tablet  Commonly known as:  MAG-OX   Take 400 mg by mouth daily. For constipation     memantine 10 MG tablet  Commonly known as:  NAMENDA  Take 10 mg by mouth 2 (two) times daily.     polyethylene glycol packet  Commonly known as:  MIRALAX / GLYCOLAX  Take 17 g by mouth daily.     pramipexole 0.25 MG tablet  Commonly known as:  MIRAPEX  Take 0.25 mg by mouth. Take one tablet at bedtime     sertraline 50 MG tablet  Commonly known as:  ZOLOFT  Take 50 mg by mouth daily.     vitamin B-12 1000 MCG tablet  Commonly known as:  CYANOCOBALAMIN  Take 1,000 mcg by mouth daily.     Vitamin D3 2000 units Tabs  Take by mouth. Take one each morning        Physical Exam: Filed Vitals:   11/13/15 1343  BP: 147/77  Pulse: 78  Temp: 97.9 F (36.6 C)  Resp: 20  Height:  (1.93 m)  Weight: 205 lb (92.987 kg)  SpO2: 93%   Body mass index is 24.96 kg/(m^2). Physical Exam  Constitutional: She appears well-developed and well-nourished. No distress.  HENT:  Head: Normocephalic and atraumatic.  Cardiovascular: Normal rate and regular rhythm.   Bilateral 1+ edema with hyperpigmentation  Pulmonary/Chest: Effort normal and breath sounds normal. No respiratory distress. She has no wheezes. She has no rales.  No rhonchi; cough dry at present  Musculoskeletal:  Uses manual wheelchair to get around [place]  Neurological: She is alert.  Oriented to person and place, not time  Skin: Skin is warm and dry.  Psychiatric: She has a normal mood and affect.    Labs reviewed: Basic Metabolic Panel:  Recent Labs  47/82/95 04/17/15 05/24/15  NA 140  --  138  K 4.4  --  4.6  BUN 24*  --  15  CREATININE 0.8  --  0.7  TSH 11.82* 4.23 2.63   Liver Function Tests:  Recent Labs  03/08/15 05/24/15  AST 15 13  ALT 13 15  ALKPHOS 77 72   No results for input(s): LIPASE, AMYLASE in the last 8760 hours. No results for input(s): AMMONIA in the last 8760 hours. CBC:  Recent Labs  03/08/15 05/24/15  WBC 7.1 6.2  HGB 12.7 12.3   HCT 39 37  PLT 314 291   Lipid Panel: No results for input(s): CHOL, HDL, LDLCALC, TRIG, CHOLHDL, LDLDIRECT in  the last 8760 hours. No results found for: HGBA1C   Assessment/Plan 1. Cough -reviewed with her son some of the potential causes -by history, it's most likely allergy-related -also discussed low sodium diet to prevent chf -does not seem to have other s/s of aspiration but will monitor -cont treatments for #2 -also noted that she is already on gerd treatment with ppi  2. Allergic rhinitis, unspecified allergic rhinitis type -cont flonase, could use zyrtec if needed to help with secretions, but cough seems dry right now and doesn't bother patient  3. Dementia without behavioral disturbance -moderate -requires assistance with adls except feeding herself -cont aricept and namenda but would favor switching over to namzaric daily to decrease pill burden  4.  Chronic venous insufficiency -avoid high sodium foods, elevate feet when seated, consider compression hose regularly in daytime if persists   Chinwe Lope L. Julissa Browning, D.O. Geriatrics [facility] [address], [place] [address] Cell Phone (Mon-Fri 8am-5pm):  [phone] On Call:  [phone] & follow prompts after 5pm & weekends Office Phone:  [phone] Office Fax:  [phone]

## 2015-12-14 ENCOUNTER — Non-Acute Institutional Stay (SKILLED_NURSING_FACILITY): Payer: Medicare Other | Admitting: Adult Health

## 2015-12-14 DIAGNOSIS — Z8673 Personal history of transient ischemic attack (TIA), and cerebral infarction without residual deficits: Secondary | ICD-10-CM | POA: Diagnosis not present

## 2015-12-14 DIAGNOSIS — F0390 Unspecified dementia without behavioral disturbance: Secondary | ICD-10-CM | POA: Diagnosis not present

## 2015-12-14 DIAGNOSIS — F329 Major depressive disorder, single episode, unspecified: Secondary | ICD-10-CM | POA: Diagnosis not present

## 2015-12-14 DIAGNOSIS — F32A Depression, unspecified: Secondary | ICD-10-CM

## 2015-12-14 DIAGNOSIS — N390 Urinary tract infection, site not specified: Secondary | ICD-10-CM | POA: Diagnosis not present

## 2015-12-22 NOTE — Progress Notes (Signed)
Patient ID: Ellen Hughes, female   DOB: 05-Nov-1921, 82 y.o.   MRN: [id number]      Patient Care Team: Kermit Balo, DO as PCP - General (Geriatric Medicine) Sharon Seller, NP as Nurse Practitioner (Nurse Practitioner) Melvenia Needles, MD as Consulting Physician (Ophthalmology) Nadara Mustard, MD as Consulting Physician (Orthopedic Surgery) Donzetta Starch, MD as Consulting Physician (Dermatology) Thana Farr, MD as Consulting Physician (Neurology)  Nursing Home Location:  [facility]   Code Status: DNR  Place of Service: SNF (31)  Chief Complaint  Patient presents with  . Medical Management of Chronic Issues    HPI:  82 y.o. female residing at [facility], skilled care. I am here to review her chronic medical issues.  She has a hx of dementia, B12 deficiency, hypothyroidism, OA, OP, chronic UTI, and falls.  She does not have any complaints for our visit today.  Vs reviewed at [facility] and remains stable.Weight trending upward above goal at 204 lbs.  Depression: has a hx of depression but is pleasant for our visit today and reports from the staff indicate that she has assimilated well to skilled care, remains on zoloft. PHQ9-0 Chronic UTI hx: currently on ceftin for prophylaxis, no issues with dysuria, abd pain, fever Dementia without behaviors: most likely vascular dementia due to h/o CVA, currently on aricept and namenda without s/e, needs updated MMSE.  Remains verbal, able to answer q's, carry conversation, and follow commands, non ambulatory due to general weakness, OA, obesity  Review of Systems:  Review of Systems  Constitutional: Negative for fever, activity change, appetite change and fatigue.  HENT: Negative for congestion.   Respiratory: Negative for cough and shortness of breath.   Cardiovascular: Positive for leg swelling. Negative for chest pain and palpitations.  Gastrointestinal: Negative for abdominal pain and abdominal  distention.  Genitourinary: Negative for dysuria and difficulty urinating.  Musculoskeletal: Positive for arthralgias (knees) and gait problem.  Neurological: Negative for dizziness and speech difficulty.  Psychiatric/Behavioral: Positive for confusion. Negative for suicidal ideas, behavioral problems, sleep disturbance, dysphoric mood and agitation. The patient is not nervous/anxious.     Medications: Patient's Medications  New Prescriptions   No medications on file  Previous Medications   ACETAMINOPHEN (TYLENOL) 325 MG TABLET    Take 650 mg by mouth. Take 2 every 6 hours as needed for pain   ASPIRIN 81 MG CHEWABLE TABLET    Chew by mouth daily.   BIOTIN 1000 MCG TABLET    Take 1,000 mcg by mouth. Take one tablet each moring   CALCIUM CARBONATE-VITAMIN D 600-400 MG-UNIT PER TABLET    Take 1 tablet by mouth daily.   CEFUROXIME (CEFTIN) 250 MG TABLET    Take 250 mg by mouth. Take one tablet each morning   CHOLECALCIFEROL (VITAMIN D3) 2000 UNITS TABS    Take by mouth. Take one each morning   DONEPEZIL (ARICEPT) 10 MG TABLET    Take 10 mg by mouth at bedtime. For memory   FLUTICASONE (FLONASE) 50 MCG/ACT NASAL SPRAY    Place 1 spray into both nostrils. Twice daily   FOLIC ACID (FOLVITE) 800 MCG TABLET    Take 800 mcg by mouth daily.    IBANDRONATE (BONIVA) 150 MG TABLET    Take 150 mg by mouth every 30 (thirty) days. Take in the morning with a full glass of water, on an empty stomach, and do not take anything else by mouth or lie down for the next 30 min.  LEVOTHYROXINE (SYNTHROID, LEVOTHROID) 75 MCG TABLET    Take 75 mcg by mouth daily before breakfast.   LUTEIN 20 MG CAPS    Take by mouth. Take one tablet each morning   MAGNESIUM OXIDE (MAG-OX) 400 MG TABLET    Take 400 mg by mouth daily. For constipation   MEMANTINE (NAMENDA) 10 MG TABLET    Take 10 mg by mouth 2 (two) times daily.   POLYETHYLENE GLYCOL (MIRALAX / GLYCOLAX) PACKET    Take 17 g by mouth daily.   PRAMIPEXOLE (MIRAPEX) 0.25  MG TABLET    Take 0.25 mg by mouth. Take one tablet at bedtime   PROBIOTIC PRODUCT (ALIGN) 4 MG CAPS    Take 4 mg by mouth. Take one each morning   SERTRALINE (ZOLOFT) 50 MG TABLET    Take 50 mg by mouth daily.   VITAMIN B-12 (CYANOCOBALAMIN) 1000 MCG TABLET    Take 1,000 mcg by mouth daily.   Modified Medications   No medications on file  Discontinued Medications   No medications on file     Physical Exam:  Filed Vitals:   12/22/15 0740  BP: 145/77  Pulse: 79  Temp: 97.5 F (36.4 C)  Resp: 17  Weight: 204 lb (92.534 kg)  SpO2: 92%   Wt Readings from Last 3 Encounters:  12/22/15 204 lb (92.534 kg)  11/13/15 205 lb (92.987 kg)  09/10/15 199 lb 12.8 oz (90.629 kg)    Physical Exam  Constitutional: No distress.  overweight  Neck: Normal range of motion. Neck supple. No JVD present. No tracheal deviation present. No thyromegaly present.  Cardiovascular: Normal rate and regular rhythm.   No murmur heard. bilat trace edema  Pulmonary/Chest: Effort normal and breath sounds normal. No respiratory distress. She has no wheezes.  Abdominal: Soft. Bowel sounds are normal. She exhibits no distension. There is no tenderness.  rotund  Lymphadenopathy:    She has no cervical adenopathy.  Neurological: She is alert.  Oriented x 2, able to f/c, MAE  Skin: Skin is warm and dry. She is not diaphoretic.  Psychiatric: Affect normal.    Labs reviewed/Significant Diagnostic Results:  Basic Metabolic Panel:  Recent Labs  16/10/96 05/24/15  NA 140 138  K 4.4 4.6  BUN 24* 15  CREATININE 0.8 0.7   Liver Function Tests:  Recent Labs  03/08/15 05/24/15  AST 15 13  ALT 13 15  ALKPHOS 77 72   No results for input(s): LIPASE, AMYLASE in the last 8760 hours. No results for input(s): AMMONIA in the last 8760 hours. CBC:  Recent Labs  03/08/15 05/24/15  WBC 7.1 6.2  HGB 12.7 12.3  HCT 39 37  PLT 314 291   CBG: No results for input(s): GLUCAP in the last 8760  hours. TSH:  Recent Labs  03/08/15 04/17/15 05/24/15  TSH 11.82* 4.23 2.63   A1C: No results found for: HGBA1C Lipid Panel: No results for input(s): CHOL, HDL, LDLCALC, TRIG, CHOLHDL, LDLDIRECT in the last 8760 hours.     Assessment/Plan  1. Dementia without behavioral disturbance -adjusted well to skilled to care -stable cognitive and functional status since arrived -continue aricept and namenda -needs update mmse  2. H/O: CVA (cerebrovascular accident) -noted on MRI in 2012, currently on aspirin -no new events  3. Major depression -no s/s of depression, negative ROS -would continue zoloft at this time, 50 mg qhs  4. Chronic UTI -no symptoms or recent infection -continue ceftin     Peggye Ley, ANP  [facility] [phone]

## 2016-01-14 ENCOUNTER — Non-Acute Institutional Stay (SKILLED_NURSING_FACILITY): Payer: Medicare Other | Admitting: Adult Health

## 2016-01-14 ENCOUNTER — Encounter: Payer: Self-pay | Admitting: Adult Health

## 2016-01-14 DIAGNOSIS — M170 Bilateral primary osteoarthritis of knee: Secondary | ICD-10-CM | POA: Diagnosis not present

## 2016-01-14 DIAGNOSIS — N390 Urinary tract infection, site not specified: Secondary | ICD-10-CM | POA: Diagnosis not present

## 2016-01-14 DIAGNOSIS — J309 Allergic rhinitis, unspecified: Secondary | ICD-10-CM | POA: Diagnosis not present

## 2016-01-14 DIAGNOSIS — F0390 Unspecified dementia without behavioral disturbance: Secondary | ICD-10-CM

## 2016-01-14 NOTE — Progress Notes (Signed)
Patient ID: Ellen Hughes, female   DOB: 08/28/1921, 82 y.o.   MRN: [id number]      Patient Care Team: Kermit Balo, DO as PCP - General (Geriatric Medicine) Sharon Seller, NP as Nurse Practitioner (Nurse Practitioner) Melvenia Needles, MD as Consulting Physician (Ophthalmology) Nadara Mustard, MD as Consulting Physician (Orthopedic Surgery) Donzetta Starch, MD as Consulting Physician (Dermatology) Thana Farr, MD as Consulting Physician (Neurology)  Nursing Home Location:  [facility]   Code Status: DNR  Place of Service: SNF (31)  Chief Complaint  Patient presents with  . Medical Management of Chronic Issues    HPI:  82 y.o. female residing at [facility], skilled care. I am here to review her chronic medical issues.  She has a hx of dementia, B12 deficiency, hypothyroidism, OA, OP, chronic UTI, and falls.  She does not have any complaints for our visit today.  Vs reviewed at [facility] and remains stable.Weight trending upward above goal at 204 lbs.    1. Dementia without behavioral disturbance Pt sitting up in wheelchair. She is alert, pleasant. She states that she has no complaints today. She denies pain. States her appetite is good and she is sleeping well. She states she doesn't go to a lot of activities because she "feels lazy." Her last MMSE was 09/11/15. Her total score was  9/30. She scored poorly in orientation, attention/calculation and recall.   2. Allergic rhinitis, unspecified allergic rhinitis type She states she is still using Flonase daily. She states she has had a runny nose. Denies itchy eyes. She has had problems with seasonal allergies.   3. Chronic UTI She denies dysuria, burning or increased frequency of urination. She has no recent events of UTIs.  4. Osteoarthritis of both knees, unspecified osteoarthritis type She states she is able to ambulate with assistance with no pain in her knees. She denies pain upon  passive ROM. She wears compression stockings.   Functional status: She is able to stand and pivot with one person assist. Pt is able to feed herself.   Review of Systems:  Review of Systems  Constitutional: Negative for fever, activity change, appetite change, fatigue and unexpected weight change.  HENT: Negative for congestion.   Eyes: Negative for discharge and itching.  Respiratory: Negative for cough, shortness of breath and wheezing.   Cardiovascular: Positive for leg swelling. Negative for chest pain and palpitations.  Gastrointestinal: Negative for abdominal pain and abdominal distention.  Genitourinary: Negative for dysuria, urgency, frequency and difficulty urinating.  Musculoskeletal: Positive for arthralgias (knees) and gait problem.  Neurological: Negative for dizziness and speech difficulty.  Psychiatric/Behavioral: Positive for confusion. Negative for suicidal ideas, behavioral problems, sleep disturbance, dysphoric mood and agitation. The patient is not nervous/anxious.   All other systems reviewed and are negative.   Medications: Patient's Medications  New Prescriptions   No medications on file  Previous Medications   ACETAMINOPHEN (TYLENOL) 325 MG TABLET    Take 650 mg by mouth. Take 2 every 6 hours as needed for pain   ASPIRIN 81 MG CHEWABLE TABLET    Chew by mouth daily.   BIOTIN 1000 MCG TABLET    Take 1,000 mcg by mouth. Take one tablet each moring   CALCIUM CARBONATE-VITAMIN D 600-400 MG-UNIT PER TABLET    Take 1 tablet by mouth daily.   CEFUROXIME (CEFTIN) 250 MG TABLET    Take 250 mg by mouth. Take one tablet each morning   CHOLECALCIFEROL (VITAMIN D3) 2000 UNITS TABS  Take by mouth. Take one each morning   DONEPEZIL (ARICEPT) 10 MG TABLET    Take 10 mg by mouth at bedtime. For memory   FLUTICASONE (FLONASE) 50 MCG/ACT NASAL SPRAY    Place 1 spray into both nostrils. Twice daily   FOLIC ACID (FOLVITE) 800 MCG TABLET    Take 800 mcg by mouth daily.     IBANDRONATE (BONIVA) 150 MG TABLET    Take 150 mg by mouth every 30 (thirty) days. Take in the morning with a full glass of water, on an empty stomach, and do not take anything else by mouth or lie down for the next 30 min.   LEVOTHYROXINE (SYNTHROID, LEVOTHROID) 75 MCG TABLET    Take 75 mcg by mouth daily before breakfast.   LUTEIN 20 MG CAPS    Take by mouth. Take one tablet each morning   MAGNESIUM OXIDE (MAG-OX) 400 MG TABLET    Take 400 mg by mouth daily. For constipation   MEMANTINE (NAMENDA) 10 MG TABLET    Take 10 mg by mouth 2 (two) times daily.   POLYETHYLENE GLYCOL (MIRALAX / GLYCOLAX) PACKET    Take 17 g by mouth daily.   PRAMIPEXOLE (MIRAPEX) 0.25 MG TABLET    Take 0.25 mg by mouth. Take one tablet at bedtime   PROBIOTIC PRODUCT (ALIGN) 4 MG CAPS    Take 4 mg by mouth. Take one each morning   SERTRALINE (ZOLOFT) 50 MG TABLET    Take 50 mg by mouth daily.   VITAMIN B-12 (CYANOCOBALAMIN) 1000 MCG TABLET    Take 1,000 mcg by mouth daily.   Modified Medications   No medications on file  Discontinued Medications   No medications on file     Physical Exam:  Filed Vitals:   01/14/16 1550  Weight: 204 lb (92.534 kg)   Wt Readings from Last 3 Encounters:  01/14/16 204 lb (92.534 kg)  12/22/15 204 lb (92.534 kg)  11/13/15 205 lb (92.987 kg)    Physical Exam  Constitutional: She is well-developed, well-nourished, and in no distress. No distress.  overweight  HENT:  Head: Normocephalic and atraumatic.  Nose: Nose normal.  Mouth/Throat: Oropharynx is clear and moist. No oropharyngeal exudate.  Eyes: Pupils are equal, round, and reactive to light. Right eye exhibits no discharge. Left eye exhibits no discharge.  Neck: Normal range of motion. Neck supple. No JVD present. No tracheal deviation present. No thyromegaly present.  Cardiovascular: Normal rate and regular rhythm.   No murmur heard. bilat trace edema  Pulmonary/Chest: Effort normal and breath sounds normal. No  respiratory distress. She has no wheezes.  Abdominal: Soft. Bowel sounds are normal. She exhibits no distension. There is no tenderness.  rotund  Musculoskeletal: Normal range of motion. She exhibits edema. She exhibits no tenderness.  Trace edema to bilat lower extremities  Lymphadenopathy:    She has no cervical adenopathy.  Neurological: She is alert.  Oriented x 2, able to f/c, MAE  Skin: Skin is warm and dry. She is not diaphoretic.  Psychiatric: Mood and affect normal.  Nursing note and vitals reviewed.   Labs reviewed/Significant Diagnostic Results:  Basic Metabolic Panel:  Recent Labs  16/10/96 05/24/15  NA 140 138  K 4.4 4.6  BUN 24* 15  CREATININE 0.8 0.7   Liver Function Tests:  Recent Labs  03/08/15 05/24/15  AST 15 13  ALT 13 15  ALKPHOS 77 72   No results for input(s): LIPASE, AMYLASE in the last 8760  hours. No results for input(s): AMMONIA in the last 8760 hours. CBC:  Recent Labs  03/08/15 05/24/15  WBC 7.1 6.2  HGB 12.7 12.3  HCT 39 37  PLT 314 291   CBG: No results for input(s): GLUCAP in the last 8760 hours. TSH:  Recent Labs  03/08/15 04/17/15 05/24/15  TSH 11.82* 4.23 2.63   A1C: No results found for: HGBA1C Lipid Panel: No results for input(s): CHOL, HDL, LDLCALC, TRIG, CHOLHDL, LDLDIRECT in the last 8760 hours.     Assessment/Plan  1. Dementia without behavioral disturbance - Stable mental and functional status -Continue Namenda and Aricept -Repeat MMSE in April  2. Allergic rhinitis, unspecified allergic rhinitis type - Stable - Continue Flonase daily for seasonal allergies  3. Chronic UTI - No recent history of UTI - Continue Ceftin  4. Osteoarthritis of both knees, unspecified osteoarthritis type - Stable. No additional complaints. -Continue Tylenol as needed for pain.    Bradly Chris, RN/DNP student Peggye Ley, ANP [facility] [phone]

## 2016-02-25 ENCOUNTER — Non-Acute Institutional Stay (SKILLED_NURSING_FACILITY): Payer: Medicare Other | Admitting: Adult Health

## 2016-02-25 DIAGNOSIS — I10 Essential (primary) hypertension: Secondary | ICD-10-CM | POA: Diagnosis not present

## 2016-02-25 DIAGNOSIS — F329 Major depressive disorder, single episode, unspecified: Secondary | ICD-10-CM

## 2016-02-25 DIAGNOSIS — M170 Bilateral primary osteoarthritis of knee: Secondary | ICD-10-CM

## 2016-02-25 DIAGNOSIS — R600 Localized edema: Secondary | ICD-10-CM

## 2016-02-25 DIAGNOSIS — M810 Age-related osteoporosis without current pathological fracture: Secondary | ICD-10-CM | POA: Diagnosis not present

## 2016-02-25 DIAGNOSIS — F32A Depression, unspecified: Secondary | ICD-10-CM

## 2016-02-25 DIAGNOSIS — R609 Edema, unspecified: Secondary | ICD-10-CM | POA: Diagnosis not present

## 2016-02-26 ENCOUNTER — Encounter: Payer: Self-pay | Admitting: Adult Health

## 2016-02-26 DIAGNOSIS — I10 Essential (primary) hypertension: Secondary | ICD-10-CM | POA: Insufficient documentation

## 2016-02-26 DIAGNOSIS — R609 Edema, unspecified: Secondary | ICD-10-CM | POA: Insufficient documentation

## 2016-02-26 NOTE — Progress Notes (Signed)
Patient ID: Ellen Hughes, female   DOB: 1921/04/15, 82 y.o.   MRN: [id number]      Patient Care Team: Kermit Balo, DO as PCP - General (Geriatric Medicine) Melvenia Needles, MD as Consulting Physician (Ophthalmology) Nadara Mustard, MD as Consulting Physician (Orthopedic Surgery) Donzetta Starch, MD as Consulting Physician (Dermatology) Thana Farr, MD as Consulting Physician (Neurology) Fletcher Anon, NP as Nurse Practitioner (Nurse Practitioner)  Nursing Home Location:  [facility]   Code Status: DNR  Place of Service: SNF (31)  Chief Complaint  Patient presents with  . Medical Management of Chronic Issues    HPI:  82 y.o. female residing at [facility], skilled care. I am here to review her chronic medical issues.  She has a hx of dementia, B12 deficiency, hypothyroidism, OA, OP, chronic UTI, and falls.  She report some right knee pain but states "its not too bad".  Also states "I am so old". Weight is stable but above goal at 202 lbs.    1. Essential hypertension -elevated for the past few readings -weight stable but edema seems worse -no sob or cp  2. Osteoarthritis of both knees, unspecified osteoarthritis type -reports pain in the right knee today but resident reports it only hurts with weight bearing (she only transfers, no ambulation)  3. Senile osteoporosis continues on Boniva, which I believe was started in April of 2016 -has a hx of vertebral compression fracture, s/p vertebroplasty  4. Depression -no reports of crying, decreased appetite etc -on zoloft 50 mg qd  5. Edema -wears compression hose but feet are in the dependent position most of the day   Functional status: She is able to stand and pivot with one person assist. Pt is able to feed herself.   Review of Systems:  Review of Systems  Constitutional: Negative for fever, activity change, appetite change, fatigue and unexpected weight change.  HENT: Negative  for congestion.   Eyes: Negative for discharge and itching.  Respiratory: Negative for cough, shortness of breath and wheezing.   Cardiovascular: Positive for leg swelling. Negative for chest pain and palpitations.  Gastrointestinal: Negative for abdominal pain and abdominal distention.  Genitourinary: Negative for dysuria, urgency, frequency and difficulty urinating.  Musculoskeletal: Positive for arthralgias (knees) and gait problem.  Neurological: Negative for dizziness and speech difficulty.  Psychiatric/Behavioral: Positive for confusion. Negative for suicidal ideas, behavioral problems, sleep disturbance, dysphoric mood and agitation. The patient is not nervous/anxious.   All other systems reviewed and are negative.   Medications: Patient's Medications  New Prescriptions   No medications on file  Previous Medications   ACETAMINOPHEN (TYLENOL) 325 MG TABLET    Take 650 mg by mouth. Take 2 every 6 hours as needed for pain   ASPIRIN 81 MG CHEWABLE TABLET    Chew by mouth daily.   BIOTIN 1000 MCG TABLET    Take 1,000 mcg by mouth. Take one tablet each moring   CALCIUM CARBONATE-VITAMIN D 600-400 MG-UNIT PER TABLET    Take 1 tablet by mouth daily.   CEFUROXIME (CEFTIN) 250 MG TABLET    Take 250 mg by mouth. Take one tablet each morning   CHOLECALCIFEROL (VITAMIN D3) 2000 UNITS TABS    Take by mouth. Take one each morning   DONEPEZIL (ARICEPT) 10 MG TABLET    Take 10 mg by mouth at bedtime. For memory   FLUTICASONE (FLONASE) 50 MCG/ACT NASAL SPRAY    Place 1 spray into both nostrils. Twice daily  FOLIC ACID (FOLVITE) 800 MCG TABLET    Take 800 mcg by mouth daily.    IBANDRONATE (BONIVA) 150 MG TABLET    Take 150 mg by mouth every 30 (thirty) days. Take in the morning with a full glass of water, on an empty stomach, and do not take anything else by mouth or lie down for the next 30 min.   LEVOTHYROXINE (SYNTHROID, LEVOTHROID) 75 MCG TABLET    Take 75 mcg by mouth daily before breakfast.    LUTEIN 20 MG CAPS    Take by mouth. Take one tablet each morning   MAGNESIUM OXIDE (MAG-OX) 400 MG TABLET    Take 400 mg by mouth daily. For constipation   MEMANTINE (NAMENDA) 10 MG TABLET    Take 10 mg by mouth 2 (two) times daily.   POLYETHYLENE GLYCOL (MIRALAX / GLYCOLAX) PACKET    Take 17 g by mouth daily.   PRAMIPEXOLE (MIRAPEX) 0.25 MG TABLET    Take 0.25 mg by mouth. Take one tablet at bedtime   PROBIOTIC PRODUCT (ALIGN) 4 MG CAPS    Take 4 mg by mouth. Take one each morning   SERTRALINE (ZOLOFT) 50 MG TABLET    Take 50 mg by mouth daily.   VITAMIN B-12 (CYANOCOBALAMIN) 1000 MCG TABLET    Take 1,000 mcg by mouth daily.   Modified Medications   No medications on file  Discontinued Medications   No medications on file     Physical Exam:  Filed Vitals:   02/26/16 1633  BP: 168/88  Pulse: 58  Temp: 97.7 F (36.5 C)  Resp: 20  Weight: 202 lb 12.8 oz (91.989 kg)   Wt Readings from Last 3 Encounters:  02/26/16 202 lb 12.8 oz (91.989 kg)  01/14/16 204 lb (92.534 kg)  12/22/15 204 lb (92.534 kg)    Physical Exam  Constitutional: She is well-developed, well-nourished, and in no distress. No distress.  overweight  HENT:  Head: Normocephalic and atraumatic.  Nose: Nose normal.  Mouth/Throat: Oropharynx is clear and moist. No oropharyngeal exudate.  Eyes: Pupils are equal, round, and reactive to light. Right eye exhibits no discharge. Left eye exhibits no discharge.  Neck: Normal range of motion. Neck supple. No JVD present. No tracheal deviation present. No thyromegaly present.  Cardiovascular: Normal rate and regular rhythm.   No murmur heard. Pulmonary/Chest: Effort normal. No respiratory distress. She has no wheezes. She has rales (bibasilar).  Abdominal: Soft. Bowel sounds are normal. She exhibits no distension. There is no tenderness.  rotund  Musculoskeletal: Normal range of motion. She exhibits edema. She exhibits no tenderness.  +1 edema in BLE  Lymphadenopathy:     She has no cervical adenopathy.  Neurological: She is alert.  Oriented x 2, able to f/c, MAE  Skin: Skin is warm and dry. She is not diaphoretic.  Psychiatric: Mood and affect normal.  Nursing note and vitals reviewed.   Labs reviewed/Significant Diagnostic Results:  Basic Metabolic Panel:  Recent Labs  16/09/9603/07/16 05/24/15  NA 140 138  K 4.4 4.6  BUN 24* 15  CREATININE 0.8 0.7   Liver Function Tests:  Recent Labs  03/08/15 05/24/15  AST 15 13  ALT 13 15  ALKPHOS 77 72   No results for input(s): LIPASE, AMYLASE in the last 8760 hours. No results for input(s): AMMONIA in the last 8760 hours. CBC:  Recent Labs  03/08/15 05/24/15  WBC 7.1 6.2  HGB 12.7 12.3  HCT 39 37  PLT 314 291  CBG: No results for input(s): GLUCAP in the last 8760 hours. TSH:  Recent Labs  03/08/15 04/17/15 05/24/15  TSH 11.82* 4.23 2.63   A1C: No results found for: HGBA1C Lipid Panel: No results for input(s): CHOL, HDL, LDLCALC, TRIG, CHOLHDL, LDLDIRECT in the last 8760 hours.     Assessment/Plan  1. Essential hypertension -elevated bp with increased edema in ext -begin HCTZ 12.5 mg qd and recheck BMP in 1 week -no sob or weight gain, but would monitor her weight closely and consider CHF if she develops these symptoms (had crackles on exam)  2. Osteoarthritis of both knees, unspecified osteoarthritis type -controlled pain, minimal weight bearing at this point -continue tylenol and supplement for pain  3. Senile osteoporosis -continue Boniva -no further BMD testing since she is non ambulatory  4. Depression -no signs of depression on exam and seems happy to be here -voices feeling old or lazy at times -continue zoloft  5. Edema -slightly worse today -continue compression hose, see #1  Peggye Ley, ANP [facility] [phone]

## 2016-03-03 LAB — BASIC METABOLIC PANEL
BUN: 14 mg/dL (ref 4–21)
Creatinine: 0.8 mg/dL (ref 0.5–1.1)
GLUCOSE: 106 mg/dL
POTASSIUM: 4.6 mmol/L (ref 3.4–5.3)
SODIUM: 144 mmol/L (ref 137–147)

## 2016-03-27 ENCOUNTER — Non-Acute Institutional Stay (SKILLED_NURSING_FACILITY): Payer: Medicare Other | Admitting: Adult Health

## 2016-03-27 DIAGNOSIS — R609 Edema, unspecified: Secondary | ICD-10-CM | POA: Insufficient documentation

## 2016-03-27 DIAGNOSIS — F0390 Unspecified dementia without behavioral disturbance: Secondary | ICD-10-CM

## 2016-03-27 DIAGNOSIS — I10 Essential (primary) hypertension: Secondary | ICD-10-CM

## 2016-03-27 DIAGNOSIS — R600 Localized edema: Secondary | ICD-10-CM | POA: Insufficient documentation

## 2016-03-27 DIAGNOSIS — M170 Bilateral primary osteoarthritis of knee: Secondary | ICD-10-CM

## 2016-03-27 DIAGNOSIS — N390 Urinary tract infection, site not specified: Secondary | ICD-10-CM

## 2016-03-27 NOTE — Progress Notes (Signed)
Patient ID: Ellen Hughes, female   DOB: 10-31-1921, 82 y.o.   MRN: [id number]      Patient Care Team: Kermit Balo, DO as PCP - General (Geriatric Medicine) Melvenia Needles, MD as Consulting Physician (Ophthalmology) Nadara Mustard, MD as Consulting Physician (Orthopedic Surgery) Donzetta Starch, MD as Consulting Physician (Dermatology) Thana Farr, MD as Consulting Physician (Neurology) Fletcher Anon, NP as Nurse Practitioner (Nurse Practitioner)  Nursing Home Location:  [facility]   Code Status: DNR  Place of Service: SNF (31)  Chief Complaint  Patient presents with  . Medical Management of Chronic Issues    HPI:  82 y.o. female residing at [facility], skilled care. I am here to review her chronic medical issues.  She has a hx of dementia, B12 deficiency, hypothyroidism, OA, OP, chronic UTI, and falls.  Weight decreased to 200 lbs from 202 last month  1. Essential hypertension -improved in the 120-130 systolic range with the addition of hctz  2. Dementia without behavioral disturbance -stable -on namenda and aricept without s/e -MMSE 9/30 on 09/11/15  3. Chronic UTI -no new episodes on chronic ceftin  4. Osteoarthritis of both knees, unspecified osteoarthritis type -denies pain today but sometimes has pain to her knees and uses prn tylenol  5. Edema extremities -unchanged -using compression hose and hctz   Functional status: She is able to stand and pivot with one person assist. Pt is able to feed herself.   Review of Systems:  Review of Systems  Constitutional: Negative for fever, activity change, appetite change, fatigue and unexpected weight change.  HENT: Negative for congestion.   Eyes: Negative for discharge and itching.  Respiratory: Negative for cough, shortness of breath and wheezing.   Cardiovascular: Positive for leg swelling. Negative for chest pain and palpitations.  Gastrointestinal: Negative for abdominal  pain and abdominal distention.  Genitourinary: Negative for dysuria, urgency, frequency and difficulty urinating.  Musculoskeletal: Positive for arthralgias (knees) and gait problem.  Neurological: Negative for dizziness and speech difficulty.  Psychiatric/Behavioral: Positive for confusion. Negative for suicidal ideas, behavioral problems, sleep disturbance, dysphoric mood and agitation. The patient is not nervous/anxious.   All other systems reviewed and are negative.   Medications: Patient's Medications  New Prescriptions   No medications on file  Previous Medications   ACETAMINOPHEN (TYLENOL) 325 MG TABLET    Take 650 mg by mouth. Take 2 every 6 hours as needed for pain   ASPIRIN 81 MG CHEWABLE TABLET    Chew by mouth daily.   BIOTIN 1000 MCG TABLET    Take 1,000 mcg by mouth. Take one tablet each moring   CALCIUM CARBONATE-VITAMIN D 600-400 MG-UNIT PER TABLET    Take 1 tablet by mouth daily.   CEFUROXIME (CEFTIN) 250 MG TABLET    Take 250 mg by mouth. Take one tablet each morning   CHOLECALCIFEROL (VITAMIN D3) 2000 UNITS TABS    Take by mouth. Take one each morning   DONEPEZIL (ARICEPT) 10 MG TABLET    Take 10 mg by mouth at bedtime. For memory   FLUTICASONE (FLONASE) 50 MCG/ACT NASAL SPRAY    Place 1 spray into both nostrils. Twice daily   FOLIC ACID (FOLVITE) 800 MCG TABLET    Take 800 mcg by mouth daily.    HYDROCHLOROTHIAZIDE (MICROZIDE) 12.5 MG CAPSULE    Take 12.5 mg by mouth daily.   IBANDRONATE (BONIVA) 150 MG TABLET    Take 150 mg by mouth every 30 (thirty) days. Take in  the morning with a full glass of water, on an empty stomach, and do not take anything else by mouth or lie down for the next 30 min.   LEVOTHYROXINE (SYNTHROID, LEVOTHROID) 75 MCG TABLET    Take 75 mcg by mouth daily before breakfast.   LUTEIN 20 MG CAPS    Take by mouth. Take one tablet each morning   MAGNESIUM OXIDE (MAG-OX) 400 MG TABLET    Take 400 mg by mouth daily. For constipation   MEMANTINE (NAMENDA)  10 MG TABLET    Take 10 mg by mouth 2 (two) times daily.   POLYETHYLENE GLYCOL (MIRALAX / GLYCOLAX) PACKET    Take 17 g by mouth daily.   PRAMIPEXOLE (MIRAPEX) 0.25 MG TABLET    Take 0.25 mg by mouth. Take one tablet at bedtime   PROBIOTIC PRODUCT (ALIGN) 4 MG CAPS    Take 4 mg by mouth. Take one each morning   SERTRALINE (ZOLOFT) 50 MG TABLET    Take 50 mg by mouth daily.   VITAMIN B-12 (CYANOCOBALAMIN) 1000 MCG TABLET    Take 1,000 mcg by mouth daily.   Modified Medications   No medications on file  Discontinued Medications   No medications on file     Physical Exam:  Filed Vitals:   03/27/16 1547  Weight: 200 lb 8 oz (90.946 kg)   Wt Readings from Last 3 Encounters:  03/27/16 200 lb 8 oz (90.946 kg)  02/26/16 202 lb 12.8 oz (91.989 kg)  01/14/16 204 lb (92.534 kg)    Physical Exam  Constitutional: She is well-developed, well-nourished, and in no distress. No distress.  overweight  HENT:  Head: Normocephalic and atraumatic.  Nose: Nose normal.  Mouth/Throat: Oropharynx is clear and moist. No oropharyngeal exudate.  Eyes: Pupils are equal, round, and reactive to light. Right eye exhibits no discharge. Left eye exhibits no discharge.  Neck: Normal range of motion. Neck supple. No JVD present. No tracheal deviation present. No thyromegaly present.  Cardiovascular: Normal rate and regular rhythm.   No murmur heard. Pulmonary/Chest: Effort normal. No respiratory distress. She has no wheezes. She has rales (bibasilar improved from last visit).  Abdominal: Soft. Bowel sounds are normal. She exhibits no distension. There is no tenderness.  rotund  Musculoskeletal: Normal range of motion. She exhibits edema. She exhibits no tenderness.  +1 edema in BLE  Lymphadenopathy:    She has no cervical adenopathy.  Neurological: She is alert.  Oriented x 2, able to f/c, MAE  Skin: Skin is warm and dry. She is not diaphoretic.  Psychiatric: Mood and affect normal.  Nursing note and  vitals reviewed.   Labs reviewed/Significant Diagnostic Results:  Basic Metabolic Panel:  Recent Labs  11/91/4706/23/16 03/03/16  NA 138 144  K 4.6 4.6  BUN 15 14  CREATININE 0.7 0.8   Liver Function Tests:  Recent Labs  05/24/15  AST 13  ALT 15  ALKPHOS 72   No results for input(s): LIPASE, AMYLASE in the last 8760 hours. No results for input(s): AMMONIA in the last 8760 hours. CBC:  Recent Labs  05/24/15  WBC 6.2  HGB 12.3  HCT 37  PLT 291   CBG: No results for input(s): GLUCAP in the last 8760 hours. TSH:  Recent Labs  04/17/15 05/24/15  TSH 4.23 2.63   A1C: No results found for: HGBA1C Lipid Panel: No results for input(s): CHOL, HDL, LDLCALC, TRIG, CHOLHDL, LDLDIRECT in the last 8760 hours.     Assessment/Plan  1. Essential hypertension -controlled  -continue hctz  2. Dementia without behavioral disturbance -stable functional status -continue aricept and namenda -no behaviors  3. Chronic UTI -continue ceftin  4. Osteoarthritis of both knees, unspecified osteoarthritis type -stable -continue tylenol prn  5. Edema extremities -unchanged -continue compression hose and elevate legs to prevent venous ulcers  Peggye Ley, ANP [facility] [phone]

## 2016-05-01 ENCOUNTER — Non-Acute Institutional Stay (SKILLED_NURSING_FACILITY): Payer: Medicare Other | Admitting: Adult Health

## 2016-05-01 DIAGNOSIS — M810 Age-related osteoporosis without current pathological fracture: Secondary | ICD-10-CM | POA: Diagnosis not present

## 2016-05-01 DIAGNOSIS — R531 Weakness: Secondary | ICD-10-CM | POA: Diagnosis not present

## 2016-05-01 DIAGNOSIS — I10 Essential (primary) hypertension: Secondary | ICD-10-CM | POA: Diagnosis not present

## 2016-05-01 DIAGNOSIS — E039 Hypothyroidism, unspecified: Secondary | ICD-10-CM | POA: Diagnosis not present

## 2016-05-01 DIAGNOSIS — R600 Localized edema: Secondary | ICD-10-CM

## 2016-05-01 NOTE — Progress Notes (Addendum)
Patient ID: Ellen Hughes, female   DOB: 07/06/1921, 82 y.o.   MRN: [id number]      Patient Care Team: Kermit Baloiffany L Reed, DO as PCP - General (Geriatric Medicine) Melvenia NeedlesLeon Cashwell, MD as Consulting Physician (Ophthalmology) Nadara MustardMarcus Duda V, MD as Consulting Physician (Orthopedic Surgery) Donzetta Starchrew Jones, MD as Consulting Physician (Dermatology) Thana FarrLeslie Reynolds, MD as Consulting Physician (Neurology) Fletcher Anonhristina Ziva Nunziata, NP as Nurse Practitioner (Nurse Practitioner)  Nursing Home Location:  [facility]   Code Status: DNR  Place of Service: SNF (31)  Chief Complaint  Patient presents with  . Medical Management of Chronic Issues    HPI:  82 [facility]. [facility]. I am here to review her chronic medical issues.  She has a hx of dementia, B12 deficiency, hypothyroidism,htn, OA, OP, chronic UTI, and falls.  Staff reports resident is sleeping more during the day, but remains pleasant and able to follow commands. She is requiring more assistance when getting out of bed and may need to move to a hoyerlift as stand up lift transfers have not been safe. She has had edema in her legs for quite sometime, weight 202 lbs, up 2 lbs from last month. Staff reports some wheezing with exertion but this is not unusual for her. She has some issues with htn and was started on hctz with improvement in bp.      Functional status: She is able to stand and pivot with one person assist. Pt is able to feed herself.   Review of Systems:  Review of Systems  Constitutional: Negative for fever, activity change, appetite change, fatigue and unexpected weight change.  HENT: Negative for congestion.   Eyes: Negative for discharge and itching.  Respiratory: Negative for cough, shortness of breath and wheezing.   Cardiovascular: Positive for leg swelling. Negative for chest pain and palpitations.  Gastrointestinal: Negative for abdominal pain, diarrhea, constipation and  abdominal distention.  Genitourinary: Negative for dysuria, urgency, frequency and difficulty urinating.  Musculoskeletal: Positive for arthralgias (knees) and gait problem. Negative for myalgias, back pain and joint swelling.  Neurological: Positive for weakness (increased general). Negative for dizziness, seizures, facial asymmetry, speech difficulty, light-headedness, numbness and headaches.  Psychiatric/Behavioral: Positive for confusion. Negative for suicidal ideas, behavioral problems, sleep disturbance, dysphoric mood and agitation. The patient is not nervous/anxious.   All other systems reviewed and are negative.   Medications: Patient's Medications  New Prescriptions   No medications on file  Previous Medications   ACETAMINOPHEN (TYLENOL) 325 MG TABLET    Take 650 mg by mouth. Take 2 every 6 hours as needed for pain   ASPIRIN 81 MG CHEWABLE TABLET    Chew by mouth daily.   BIOTIN 1000 MCG TABLET    Take 1,000 mcg by mouth. Take one tablet each moring   CALCIUM CARBONATE-VITAMIN D 600-400 MG-UNIT PER TABLET    Take 1 tablet by mouth daily.   CEFUROXIME (CEFTIN) 250 MG TABLET    Take 250 mg by mouth. Take one tablet each morning   CHOLECALCIFEROL (VITAMIN D3) 2000 UNITS TABS    Take by mouth. Take one each morning   DONEPEZIL (ARICEPT) 10 MG TABLET    Take 10 mg by mouth at bedtime. For memory   FLUTICASONE (FLONASE) 50 MCG/ACT NASAL SPRAY    Place 1 spray into both nostrils. Twice daily   FOLIC ACID (FOLVITE) 800 MCG TABLET    Take 800 mcg by mouth daily.    HYDROCHLOROTHIAZIDE (MICROZIDE) 12.5 MG  CAPSULE    Take 12.5 mg by mouth daily.   IBANDRONATE (BONIVA) 150 MG TABLET    Take 150 mg by mouth every 30 (thirty) days. Take in the morning with a full glass of water, on an empty stomach, and do not take anything else by mouth or lie down for the next 30 min.   LEVOTHYROXINE (SYNTHROID, LEVOTHROID) 75 MCG TABLET    Take 75 mcg by mouth daily before breakfast.   LUTEIN 20 MG CAPS     Take by mouth. Take one tablet each morning   MAGNESIUM OXIDE (MAG-OX) 400 MG TABLET    Take 400 mg by mouth daily. For constipation   MEMANTINE (NAMENDA) 10 MG TABLET    Take 10 mg by mouth 2 (two) times daily.   POLYETHYLENE GLYCOL (MIRALAX / GLYCOLAX) PACKET    Take 17 g by mouth daily.   PRAMIPEXOLE (MIRAPEX) 0.25 MG TABLET    Take 0.25 mg by mouth. Take one tablet at bedtime   PROBIOTIC PRODUCT (ALIGN) 4 MG CAPS    Take 4 mg by mouth. Take one each morning   SERTRALINE (ZOLOFT) 50 MG TABLET    Take 50 mg by mouth daily.   VITAMIN B-12 (CYANOCOBALAMIN) 1000 MCG TABLET    Take 1,000 mcg by mouth daily.   Modified Medications   No medications on file  Discontinued Medications   No medications on file     Physical Exam:  Filed Vitals:   05/01/16 1510  BP: 150/86  Pulse: 86  Temp: 97 F (36.1 C)  Resp: 18  Weight: 202 lb 3.2 oz (91.717 kg)  SpO2: 93%   Wt Readings from Last 3 Encounters:  05/01/16 202 lb 3.2 oz (91.717 kg)  03/27/16 200 lb 8 oz (90.946 kg)  02/26/16 202 lb 12.8 oz (91.989 kg)    Physical Exam  Constitutional: She is well-developed, well-nourished, and in no distress. No distress.  overweight  HENT:  Head: Normocephalic and atraumatic.  Nose: Nose normal.  Mouth/Throat: Oropharynx is clear and moist. No oropharyngeal exudate.  Eyes: Pupils are equal, round, and reactive to light. Right eye exhibits no discharge. Left eye exhibits no discharge.  Neck: Normal range of motion. Neck supple. No JVD present. No tracheal deviation present. No thyromegaly present.  Cardiovascular: Normal rate and regular rhythm.   No murmur heard. BLE edema +1  Pulmonary/Chest: Effort normal. No respiratory distress. She has no wheezes. She has rales (bibasilar ).  Abdominal: Soft. Bowel sounds are normal. She exhibits no distension. There is no tenderness.  rotund  Musculoskeletal: Normal range of motion. She exhibits edema. She exhibits no tenderness.  +1 edema in BLE    Lymphadenopathy:    She has no cervical adenopathy.  Neurological: She is alert.  Oriented x 2, able to f/c, MAE  Skin: Skin is warm and dry. She is not diaphoretic.  Psychiatric: Mood and affect normal.  Nursing note and vitals reviewed.   Labs reviewed/Significant Diagnostic Results:  Basic Metabolic Panel:  Recent Labs  16/10/96 03/03/16  NA 138 144  K 4.6 4.6  BUN 15 14  CREATININE 0.7 0.8   Liver Function Tests:  Recent Labs  05/24/15  AST 13  ALT 15  ALKPHOS 72   No results for input(s): LIPASE, AMYLASE in the last 8760 hours. No results for input(s): AMMONIA in the last 8760 hours. CBC:  Recent Labs  05/24/15  WBC 6.2  HGB 12.3  HCT 37  PLT 291  CBG: No results for input(s): GLUCAP in the last 8760 hours. TSH:  Recent Labs  05/24/15  TSH 2.63   A1C: No results found for: HGBA1C Lipid Panel: No results for input(s): CHOL, HDL, LDLCALC, TRIG, CHOLHDL, LDLDIRECT in the last 8760 hours.     Assessment/Plan  1. Generalized weakness -no obvious signs of infection, neurologic issues, etc -will have PT eval and tx  2. Essential hypertension -elevated today, may need lasix rather than hctz see below  3. Edema of both legs -noted to lower ext with crackles on exam, ?chf -has no sob or hypoxia -will check CXR and BNP  4. Hypothyroidism, unspecified hypothyroidism type -check TSH  -continue synthroid and adjust as necessary  5. Osteoporosis -no longer ambulatory will d/c boniva -continue Vit D and Calcium -also will d/c vitamins that resident does not like to swallow   Peggye Ley, ANP [facility] [phone]

## 2016-05-02 LAB — CBC AND DIFFERENTIAL
HCT: 45 % (ref 36–46)
Hemoglobin: 13.4 g/dL (ref 12.0–16.0)
Platelets: 322 10*3/uL (ref 150–399)
WBC: 7.8 10^3/mL

## 2016-06-24 ENCOUNTER — Non-Acute Institutional Stay (SKILLED_NURSING_FACILITY): Payer: Medicare Other | Admitting: Internal Medicine

## 2016-06-24 ENCOUNTER — Encounter: Payer: Self-pay | Admitting: Internal Medicine

## 2016-06-24 DIAGNOSIS — M810 Age-related osteoporosis without current pathological fracture: Secondary | ICD-10-CM | POA: Diagnosis not present

## 2016-06-24 DIAGNOSIS — E039 Hypothyroidism, unspecified: Secondary | ICD-10-CM | POA: Diagnosis not present

## 2016-06-24 DIAGNOSIS — R269 Unspecified abnormalities of gait and mobility: Secondary | ICD-10-CM

## 2016-06-24 DIAGNOSIS — R05 Cough: Secondary | ICD-10-CM | POA: Diagnosis not present

## 2016-06-24 DIAGNOSIS — R053 Chronic cough: Secondary | ICD-10-CM

## 2016-06-24 DIAGNOSIS — F32A Depression, unspecified: Secondary | ICD-10-CM

## 2016-06-24 DIAGNOSIS — F0390 Unspecified dementia without behavioral disturbance: Secondary | ICD-10-CM | POA: Diagnosis not present

## 2016-06-24 DIAGNOSIS — F329 Major depressive disorder, single episode, unspecified: Secondary | ICD-10-CM

## 2016-06-24 NOTE — Progress Notes (Signed)
Patient ID: Ellen Hughes, female   DOB: Apr 08, 1921, 82 y.o.   MRN: [id number]  Location:   [facility] Room Number: 134 Place of Service:  SNF (31) Provider:  Johnnie Moten L. Renato Gails, D.O., C.M.D.  Bufford Spikes, DO  Patient Care Team: Kermit Balo, DO as PCP - General (Geriatric Medicine) Melvenia Needles, MD as Consulting Physician (Ophthalmology) Nadara Mustard, MD as Consulting Physician (Orthopedic Surgery) Donzetta Starch, MD as Consulting Physician (Dermatology) Thana Farr, MD as Consulting Physician (Neurology) Fletcher Anon, NP as Nurse Practitioner (Nurse Practitioner)  Extended Emergency Contact Information Primary Emergency Contact: [facility] Address: [address] [place], [place] [address] [place] Home Phone: [phone] Mobile Phone: [phone] Relation: Daughter  Code Status:  DNR Goals of care: Advanced Directive information Advanced Directives 06/24/2016  Does patient have an advance directive? Yes  Type of Advance Directive Healthcare Power of Attorney  Does patient want to make changes to advanced directive? -  Copy of advanced directive(s) in chart? Yes  Pre-existing out of facility DNR order (yellow form or pink MOST form) -     Chief Complaint  Patient presents with  . Medical Management of Chronic Issues    routine visit    HPI:  Pt is a 82 y.o. female seen today for medical management of chronic diseases.    When I entered to see her, she was resting in bed, but coughing and sneezing quite a bit--bed was flat.  She said she felt bad and it was going on for several days (but staff reported that it's not new or different for her).  She has been afebrile and not able to expectorate any mucus.  She denies sore throat or headache.   Her dementia is worsening gradually and she is not able to provide a good history.  Her son is not here visiting this time.  She is on aricept and namenda.    Bowels are moving with  miralax.    Spirits seem to be good on zoloft (staff do not mention signs or symptoms of depression).  She attends activities.    For osteoporosis, she is on ca with D and additional D but no other meds. She also does not ambulate--uses wheelchair to get around.    Past Medical History:  Diagnosis Date  . Abnormality of gait and mobility   . Depression   . Gastroesophageal reflux disease without esophagitis   . Hereditary and idiopathic neuropathy   . Hypothyroidism   . Irregular sleep-wake rhythm, nonorganic origin   . Osteoarthritis   . Osteoporosis   . Vertigo   . Vitamin B 12 deficiency    Past Surgical History:  Procedure Laterality Date  . ALLOGRAFT APPLICATION  2008  . CHOLECYSTECTOMY    . COSMETIC SURGERY    . ORIF NONUNION HUMERUS FRACTURE Left    OP1  . t12 vertebroplasty      Allergies  Allergen Reactions  . Codeine   . Morphine And Related       Medication List       Accurate as of 06/24/16 11:02 AM. Always use your most recent med list.          acetaminophen 325 MG tablet Commonly known as:  TYLENOL Take 650 mg by mouth. Take 2 every 6 hours as needed for pain   ALIGN 4 MG Caps Take 4 mg by mouth. Take one each morning   aspirin 81  MG chewable tablet Chew by mouth daily.   Calcium Carbonate-Vitamin D 600-400 MG-UNIT tablet Take 1 tablet by mouth daily.   cefUROXime 250 MG tablet Commonly known as:  CEFTIN Take 250 mg by mouth. Take one tablet each morning   donepezil 10 MG tablet Commonly known as:  ARICEPT Take 10 mg by mouth at bedtime. For memory   fluticasone 50 MCG/ACT nasal spray Commonly known as:  FLONASE Place 1 spray into both nostrils. Twice daily   hydrochlorothiazide 12.5 MG capsule Commonly known as:  MICROZIDE Take 12.5 mg by mouth daily.   levothyroxine 75 MCG tablet Commonly known as:  SYNTHROID, LEVOTHROID Take 75 mcg by mouth daily before breakfast.   memantine 10 MG tablet Commonly known as:   NAMENDA Take 10 mg by mouth 2 (two) times daily.   polyethylene glycol packet Commonly known as:  MIRALAX / GLYCOLAX Take 17 g by mouth daily.   pramipexole 0.25 MG tablet Commonly known as:  MIRAPEX Take 0.25 mg by mouth. Take one tablet at bedtime   sertraline 50 MG tablet Commonly known as:  ZOLOFT Take 50 mg by mouth daily.   vitamin B-12 1000 MCG tablet Commonly known as:  CYANOCOBALAMIN Take 1,000 mcg by mouth daily.   Vitamin D3 2000 units Tabs Take by mouth. Take one each morning       Review of Systems  Constitutional: Negative for activity change, appetite change, chills and fever.  HENT: Positive for congestion and sneezing. Negative for postnasal drip, rhinorrhea, sinus pressure and sore throat.   Eyes: Negative for visual disturbance.  Respiratory: Positive for cough. Negative for shortness of breath.   Cardiovascular: Negative for chest pain and leg swelling.  Gastrointestinal: Negative for abdominal pain.  Endocrine: Negative for polydipsia.  Genitourinary: Negative for dysuria.  Musculoskeletal: Positive for gait problem.  Skin: Negative for color change.  Neurological: Negative for dizziness.       Right foot drop  Psychiatric/Behavioral: Positive for confusion.    Immunization History  Administered Date(s) Administered  . Influenza-Unspecified 09/13/2015  . Zoster 07/21/2012   Pertinent  Health Maintenance Due  Topic Date Due  . DEXA SCAN  08/07/1988  . PNA vac Low Risk Adult (1 of 2 - PCV13) 08/07/1988  . INFLUENZA VACCINE  07/01/2016   Fall Risk  03/07/2015  Falls in the past year? Yes  Number falls in past yr: 1   Functional Status Survey:    Vitals:   06/24/16 1047  BP: (!) 153/88  Pulse: 81  Resp: 20  Temp: 97 F (36.1 C)  TempSrc: Oral  SpO2: 93%  Weight: 200 lb (90.7 kg)   Body mass index is 24.34 kg/m. Physical Exam  Constitutional: She appears well-developed and well-nourished. No distress.  HENT:  Head: Normocephalic  and atraumatic.  Mouth/Throat: No oropharyngeal exudate.  Cardiovascular: Normal rate and regular rhythm.   Pulmonary/Chest: Effort normal and breath sounds normal. She has no wheezes.  Upper airway conductive sounds  Abdominal: Soft. Bowel sounds are normal.  Musculoskeletal:  Right foot drop; resting in bed  Lymphadenopathy:    She has no cervical adenopathy.  Neurological: She is alert.  Skin: Skin is warm and dry.  Psychiatric: She has a normal mood and affect.    Labs reviewed:  Recent Labs  03/03/16  NA 144  K 4.6  BUN 14  CREATININE 0.8   No results for input(s): AST, ALT, ALKPHOS, BILITOT, PROT, ALBUMIN in the last 8760 hours.  Recent Labs  05/02/16 0300  WBC 7.8  HGB 13.4  HCT 45  PLT 322   Lab Results  Component Value Date   TSH 2.63 05/24/2015   Assessment/Plan 1. Dementia without behavioral disturbance -gradually progressing -remains on aricept and namenda -need to check mmse next visit to see how she's doing on this  2. Senile osteoporosis -not on meds just vitamins for this and not weightbearing now so benefits limited  3. Depression -stable on low dose zoloft  4. Hypothyroidism, unspecified hypothyroidism type -cont on current dose synthroid Lab Results  Component Value Date   TSH 2.63 05/24/2015  -needs tsh if last was really June last year (suspect not abstracted)  5. Chronic cough -elevate HOB 30 degrees at least to prevent aspiration and help with secretions -cont flonase and cont to monitor  6. Abnormality of gait -uses wheelchair to get around  Family/ staff Communication: discussed with snf nursing  Labs/tests ordered:  Will need TSH if not done since last summer

## 2016-06-29 DIAGNOSIS — R053 Chronic cough: Secondary | ICD-10-CM | POA: Insufficient documentation

## 2016-06-29 DIAGNOSIS — R05 Cough: Secondary | ICD-10-CM | POA: Insufficient documentation

## 2016-07-14 ENCOUNTER — Non-Acute Institutional Stay (SKILLED_NURSING_FACILITY): Payer: Medicare Other | Admitting: Adult Health

## 2016-07-14 ENCOUNTER — Encounter: Payer: Self-pay | Admitting: Adult Health

## 2016-07-14 DIAGNOSIS — R05 Cough: Secondary | ICD-10-CM | POA: Diagnosis not present

## 2016-07-14 DIAGNOSIS — R059 Cough, unspecified: Secondary | ICD-10-CM

## 2016-07-14 DIAGNOSIS — J309 Allergic rhinitis, unspecified: Secondary | ICD-10-CM | POA: Diagnosis not present

## 2016-07-14 NOTE — Progress Notes (Signed)
[facility]   Place of Service:  SNF (31) Provider:   Peggye Leyhristy Nazier Neyhart, ANP [facility] [phone]   REED, Elmarie ShileyIFFANY, DO  Patient Care Team: Kermit Baloiffany L Reed, DO as PCP - General (Geriatric Medicine) Melvenia NeedlesLeon Cashwell, MD as Consulting Physician (Ophthalmology) Nadara MustardMarcus V Duda, MD as Consulting Physician (Orthopedic Surgery) Donzetta Starchrew Jones, MD as Consulting Physician (Dermatology) Thana FarrLeslie Reynolds, MD as Consulting Physician (Neurology) Fletcher Anonhristina Isma Tietje, NP as Nurse Practitioner (Nurse Practitioner)  Extended Emergency Contact Information Primary Emergency Contact: [facility]arker,[name] Address: [address]          [place], [place] [address] [place] Home Phone: [phone] Mobile Phone: [phone] Relation: Daughter Code Status:  DNR Goals of care: Advanced Directive information Advanced Directives 06/24/2016  Does patient have an advance directive? Yes  Type of Advance Directive Healthcare Power of Attorney  Does patient want to make changes to advanced directive? -  Copy of advanced directive(s) in chart? Yes  Pre-existing out of facility DNR order (yellow form or pink MOST form) -     Chief Complaint  Patient presents with  . Acute Visit    cough    HPI:  Pt is a 82 y.o. female seen today for an acute visit for cough, present off and on for at least two weeks per the staff, although she has a noted hx of chronic cough. The resident has dementia and can not contribute to the hx, but can answer questions about her current status.  She denies any sob or chest pain, feels well.  Her weight is trending down to 195 lbs.  She has a hx of allergic rhinitis and is on flonase. The staff reports the cough is the worst in the morning and when lying down. They deny any coughing with meals. She is current working with [facility] and the speech therapist is requesting a swallow study.  No fever or purulent sputum noted. No recent issues with pna.   Past Medical  History:  Diagnosis Date  . Abnormality of gait and mobility   . Depression   . Gastroesophageal reflux disease without esophagitis   . Hereditary and idiopathic neuropathy   . Hypothyroidism   . Irregular sleep-wake rhythm, nonorganic origin   . Osteoarthritis   . Osteoporosis   . Vertigo   . Vitamin B 12 deficiency    Past Surgical History:  Procedure Laterality Date  . ALLOGRAFT APPLICATION  2008  . CHOLECYSTECTOMY    . COSMETIC SURGERY    . ORIF NONUNION HUMERUS FRACTURE Left    OP1  . t12 vertebroplasty      Allergies  Allergen Reactions  . Codeine   . Morphine And Related       Medication List       Accurate as of 07/14/16  4:54 PM. Always use your most recent med list.          acetaminophen 325 MG tablet Commonly known as:  TYLENOL Take 650 mg by mouth. Take 2 every 6 hours as needed for pain   ALIGN 4 MG Caps Take 4 mg by mouth. Take one each morning   aspirin 81 MG chewable tablet Chew by mouth daily.   Calcium Carbonate-Vitamin D 600-400 MG-UNIT tablet Take 1 tablet by mouth daily.   cefUROXime 250 MG tablet Commonly known as:  CEFTIN Take 250 mg by mouth. Take one tablet each morning   donepezil 10 MG tablet Commonly known as:  ARICEPT Take 10 mg by mouth  at bedtime. For memory   fluticasone 50 MCG/ACT nasal spray Commonly known as:  FLONASE Place 1 spray into both nostrils. Twice daily   hydrochlorothiazide 12.5 MG capsule Commonly known as:  MICROZIDE Take 12.5 mg by mouth daily.   levothyroxine 75 MCG tablet Commonly known as:  SYNTHROID, LEVOTHROID Take 75 mcg by mouth daily before breakfast.   memantine 10 MG tablet Commonly known as:  NAMENDA Take 10 mg by mouth 2 (two) times daily.   polyethylene glycol packet Commonly known as:  MIRALAX / GLYCOLAX Take 17 g by mouth daily.   pramipexole 0.25 MG tablet Commonly known as:  MIRAPEX Take 0.25 mg by mouth. Take one tablet at bedtime   sertraline 50 MG tablet Commonly  known as:  ZOLOFT Take 50 mg by mouth daily.   vitamin B-12 1000 MCG tablet Commonly known as:  CYANOCOBALAMIN Take 1,000 mcg by mouth daily.   Vitamin D3 2000 units Tabs Take by mouth. Take one each morning       Review of Systems  Constitutional: Negative for activity change, appetite change, chills, diaphoresis, fatigue, fever and unexpected weight change.  HENT: Positive for congestion, postnasal drip and rhinorrhea. Negative for dental problem, ear discharge, ear pain, facial swelling, sinus pressure, sneezing, sore throat, tinnitus, trouble swallowing and voice change.   Eyes: Negative for pain, redness and visual disturbance.  Respiratory: Positive for cough. Negative for choking, shortness of breath and wheezing.   Cardiovascular: Positive for leg swelling. Negative for chest pain and palpitations.  Gastrointestinal: Negative for abdominal distention, constipation and diarrhea.  Genitourinary: Negative for dysuria.  Musculoskeletal: Positive for arthralgias and gait problem.  Neurological: Negative for dizziness.  Psychiatric/Behavioral: Positive for confusion. Negative for agitation and behavioral problems.    Immunization History  Administered Date(s) Administered  . Influenza-Unspecified 09/13/2015  . Zoster 07/21/2012   Pertinent  Health Maintenance Due  Topic Date Due  . DEXA SCAN  08/07/1988  . PNA vac Low Risk Adult (1 of 2 - PCV13) 08/07/1988  . INFLUENZA VACCINE  07/01/2016   Fall Risk  03/07/2015  Falls in the past year? Yes  Number falls in past yr: 1   Functional Status Survey:    Vitals:   07/14/16 1636  BP: 130/76  Pulse: 93  Resp: 20  Temp: 97.4 F (36.3 C)  SpO2: 98%  Weight: 195 lb (88.5 kg)   Body mass index is 23.74 kg/m. Physical Exam  Constitutional: No distress.  HENT:  Head: Normocephalic and atraumatic.  Mouth/Throat: No oropharyngeal exudate.  Cerumen to both ear canals, not able to visualize TM.  Erythema and clear drainage  to both nares  Eyes: Conjunctivae are normal. Pupils are equal, round, and reactive to light. Right eye exhibits no discharge. Left eye exhibits no discharge.  Neck: Normal range of motion. Neck supple. No JVD present. No tracheal deviation present. No thyromegaly present.  Cardiovascular: Normal rate and regular rhythm.   No murmur heard. Venous stasis change, bilat lower ext edema +1  Pulmonary/Chest: Effort normal and breath sounds normal. No respiratory distress.  Lymphadenopathy:    She has cervical adenopathy.  Neurological: She is alert. She has normal reflexes.  Oriented to self and place  Skin: Skin is warm and dry. She is not diaphoretic.  Psychiatric: She has a normal mood and affect.    Labs reviewed:  Recent Labs  03/03/16  NA 144  K 4.6  BUN 14  CREATININE 0.8   No results for input(s): AST,  ALT, ALKPHOS, BILITOT, PROT, ALBUMIN in the last 8760 hours.  Recent Labs  05/02/16 0300  WBC 7.8  HGB 13.4  HCT 45  PLT 322   Lab Results  Component Value Date   TSH 2.63 05/24/2015   No results found for: HGBA1C No results found for: CHOL, HDL, LDLCALC, LDLDIRECT, TRIG, CHOLHDL  Significant Diagnostic Results in last 30 days:  No results found.  Assessment/Plan  1) Cough -No fever or adventitious breath sounds noted, however, she does have some rhinitis noted on exam, currently on flonase  -Begin Zyrtec 5 mg qhs -She is followed by speech therapy who has requested a modified barium swallow to see if this is contributing to her cough  2) Allergic rhinitis -continue flonase, see above  Family/ staff Communication: discussed with staff to communicate with family, was not able to reach them by phone  Peggye Leyhristy Thermon Zulauf, ANP [facility] [phone]

## 2016-07-24 ENCOUNTER — Inpatient Hospital Stay (HOSPITAL_COMMUNITY)
Admission: EM | Admit: 2016-07-24 | Discharge: 2016-07-28 | DRG: 177 | Disposition: A | Discharge status: Skilled Nursing Facility | Payer: Medicare Other | Attending: Internal Medicine | Admitting: Internal Medicine

## 2016-07-24 ENCOUNTER — Non-Acute Institutional Stay (SKILLED_NURSING_FACILITY): Payer: Medicare Other | Admitting: Adult Health

## 2016-07-24 ENCOUNTER — Emergency Department (HOSPITAL_COMMUNITY): Payer: Medicare Other

## 2016-07-24 ENCOUNTER — Encounter (HOSPITAL_COMMUNITY): Payer: Self-pay

## 2016-07-24 ENCOUNTER — Encounter: Payer: Self-pay | Admitting: Adult Health

## 2016-07-24 DIAGNOSIS — J189 Pneumonia, unspecified organism: Secondary | ICD-10-CM | POA: Diagnosis present

## 2016-07-24 DIAGNOSIS — Z7951 Long term (current) use of inhaled steroids: Secondary | ICD-10-CM

## 2016-07-24 DIAGNOSIS — J309 Allergic rhinitis, unspecified: Secondary | ICD-10-CM | POA: Diagnosis not present

## 2016-07-24 DIAGNOSIS — J9601 Acute respiratory failure with hypoxia: Secondary | ICD-10-CM | POA: Diagnosis present

## 2016-07-24 DIAGNOSIS — F5089 Other specified eating disorder: Secondary | ICD-10-CM

## 2016-07-24 DIAGNOSIS — N390 Urinary tract infection, site not specified: Secondary | ICD-10-CM | POA: Diagnosis not present

## 2016-07-24 DIAGNOSIS — R053 Chronic cough: Secondary | ICD-10-CM

## 2016-07-24 DIAGNOSIS — E038 Other specified hypothyroidism: Secondary | ICD-10-CM | POA: Diagnosis not present

## 2016-07-24 DIAGNOSIS — E039 Hypothyroidism, unspecified: Secondary | ICD-10-CM | POA: Diagnosis present

## 2016-07-24 DIAGNOSIS — F0390 Unspecified dementia without behavioral disturbance: Secondary | ICD-10-CM | POA: Diagnosis present

## 2016-07-24 DIAGNOSIS — Z66 Do not resuscitate: Secondary | ICD-10-CM | POA: Diagnosis present

## 2016-07-24 DIAGNOSIS — M810 Age-related osteoporosis without current pathological fracture: Secondary | ICD-10-CM | POA: Diagnosis present

## 2016-07-24 DIAGNOSIS — Z7982 Long term (current) use of aspirin: Secondary | ICD-10-CM | POA: Diagnosis not present

## 2016-07-24 DIAGNOSIS — Z79899 Other long term (current) drug therapy: Secondary | ICD-10-CM

## 2016-07-24 DIAGNOSIS — K5900 Constipation, unspecified: Secondary | ICD-10-CM | POA: Diagnosis present

## 2016-07-24 DIAGNOSIS — R05 Cough: Secondary | ICD-10-CM | POA: Diagnosis not present

## 2016-07-24 DIAGNOSIS — Z808 Family history of malignant neoplasm of other organs or systems: Secondary | ICD-10-CM | POA: Diagnosis not present

## 2016-07-24 DIAGNOSIS — K219 Gastro-esophageal reflux disease without esophagitis: Secondary | ICD-10-CM | POA: Diagnosis present

## 2016-07-24 DIAGNOSIS — J9621 Acute and chronic respiratory failure with hypoxia: Secondary | ICD-10-CM | POA: Diagnosis not present

## 2016-07-24 DIAGNOSIS — R0600 Dyspnea, unspecified: Secondary | ICD-10-CM

## 2016-07-24 DIAGNOSIS — I10 Essential (primary) hypertension: Secondary | ICD-10-CM

## 2016-07-24 DIAGNOSIS — R1114 Bilious vomiting: Secondary | ICD-10-CM | POA: Diagnosis not present

## 2016-07-24 DIAGNOSIS — R1110 Vomiting, unspecified: Secondary | ICD-10-CM | POA: Diagnosis not present

## 2016-07-24 DIAGNOSIS — T17800A Unspecified foreign body in other parts of respiratory tract causing asphyxiation, initial encounter: Secondary | ICD-10-CM

## 2016-07-24 DIAGNOSIS — J690 Pneumonitis due to inhalation of food and vomit: Secondary | ICD-10-CM | POA: Diagnosis not present

## 2016-07-24 LAB — CBC WITH DIFFERENTIAL/PLATELET
BASOS PCT: 1 %
Basophils Absolute: 0.1 10*3/uL (ref 0.0–0.1)
EOS ABS: 0.2 10*3/uL (ref 0.0–0.7)
Eosinophils Relative: 2 %
HEMATOCRIT: 46.3 % — AB (ref 36.0–46.0)
HEMOGLOBIN: 14.6 g/dL (ref 12.0–15.0)
LYMPHS ABS: 2 10*3/uL (ref 0.7–4.0)
Lymphocytes Relative: 21 %
MCH: 31.1 pg (ref 26.0–34.0)
MCHC: 31.5 g/dL (ref 30.0–36.0)
MCV: 98.7 fL (ref 78.0–100.0)
Monocytes Absolute: 0.9 10*3/uL (ref 0.1–1.0)
Monocytes Relative: 9 %
NEUTROS ABS: 6.4 10*3/uL (ref 1.7–7.7)
NEUTROS PCT: 67 %
Platelets: 374 10*3/uL (ref 150–400)
RBC: 4.69 MIL/uL (ref 3.87–5.11)
RDW: 13.9 % (ref 11.5–15.5)
WBC: 9.6 10*3/uL (ref 4.0–10.5)

## 2016-07-24 LAB — LIPASE, BLOOD: Lipase: 18 U/L (ref 11–51)

## 2016-07-24 LAB — COMPREHENSIVE METABOLIC PANEL
ALBUMIN: 3.8 g/dL (ref 3.5–5.0)
ALK PHOS: 93 U/L (ref 38–126)
ALT: 20 U/L (ref 14–54)
AST: 21 U/L (ref 15–41)
Anion gap: 8 (ref 5–15)
BUN: 18 mg/dL (ref 6–20)
CALCIUM: 10.1 mg/dL (ref 8.9–10.3)
CO2: 35 mmol/L — AB (ref 22–32)
CREATININE: 1 mg/dL (ref 0.44–1.00)
Chloride: 100 mmol/L — ABNORMAL LOW (ref 101–111)
GFR calc Af Amer: 55 mL/min — ABNORMAL LOW (ref >60)
GFR calc non Af Amer: 47 mL/min — ABNORMAL LOW (ref >60)
GLUCOSE: 114 mg/dL — AB (ref 65–99)
Potassium: 3.5 mmol/L (ref 3.5–5.1)
SODIUM: 143 mmol/L (ref 135–145)
Total Bilirubin: 0.5 mg/dL (ref 0.3–1.2)
Total Protein: 7.7 g/dL (ref 6.5–8.1)

## 2016-07-24 LAB — I-STAT TROPONIN, ED: Troponin i, poc: 0 ng/mL (ref 0.00–0.08)

## 2016-07-24 MED ORDER — DEXTROSE 5 % IV SOLN
1.0000 g | Freq: Once | INTRAVENOUS | Status: AC
  Administered 2016-07-24: 1 g via INTRAVENOUS
  Filled 2016-07-24: qty 10

## 2016-07-24 MED ORDER — DEXTROSE 5 % IV SOLN
500.0000 mg | Freq: Once | INTRAVENOUS | Status: AC
  Administered 2016-07-25: 500 mg via INTRAVENOUS
  Filled 2016-07-24: qty 500

## 2016-07-24 MED ORDER — LORATADINE 10 MG PO TABS
10.0000 mg | ORAL_TABLET | Freq: Every day | ORAL | Status: DC
  Administered 2016-07-26 – 2016-07-28 (×3): 10 mg via ORAL
  Filled 2016-07-24 (×3): qty 1

## 2016-07-24 MED ORDER — ACETAMINOPHEN 325 MG PO TABS: 650.0000 mg | ORAL_TABLET | Freq: Four times a day (QID) | ORAL | Status: DC | PRN

## 2016-07-24 MED ORDER — AZITHROMYCIN 500 MG PO TABS
500.0000 mg | ORAL_TABLET | ORAL | Status: DC
  Administered 2016-07-25 – 2016-07-26 (×2): 500 mg via ORAL
  Filled 2016-07-24 (×2): qty 1

## 2016-07-24 MED ORDER — ENOXAPARIN SODIUM 40 MG/0.4ML ~~LOC~~ SOLN
40.0000 mg | SUBCUTANEOUS | Status: DC
  Administered 2016-07-25 – 2016-07-28 (×4): 40 mg via SUBCUTANEOUS
  Filled 2016-07-24 (×4): qty 0.4

## 2016-07-24 MED ORDER — SERTRALINE HCL 50 MG PO TABS
50.0000 mg | ORAL_TABLET | Freq: Every day | ORAL | Status: DC
  Administered 2016-07-26 – 2016-07-28 (×3): 50 mg via ORAL
  Filled 2016-07-24 (×3): qty 1

## 2016-07-24 MED ORDER — DIATRIZOATE MEGLUMINE & SODIUM 66-10 % PO SOLN
15.0000 mL | Freq: Once | ORAL | Status: AC
  Administered 2016-07-24: 15 mL via ORAL

## 2016-07-24 MED ORDER — PRAMIPEXOLE DIHYDROCHLORIDE 0.25 MG PO TABS
0.2500 mg | ORAL_TABLET | Freq: Three times a day (TID) | ORAL | Status: DC
  Administered 2016-07-25 – 2016-07-28 (×9): 0.25 mg via ORAL
  Filled 2016-07-24 (×12): qty 1

## 2016-07-24 MED ORDER — LEVOTHYROXINE SODIUM 88 MCG PO TABS
88.0000 ug | ORAL_TABLET | Freq: Every day | ORAL | Status: DC
  Administered 2016-07-25 – 2016-07-28 (×4): 88 ug via ORAL
  Filled 2016-07-24 (×4): qty 1

## 2016-07-24 MED ORDER — ASPIRIN 81 MG PO CHEW
81.0000 mg | CHEWABLE_TABLET | Freq: Every day | ORAL | Status: DC
  Administered 2016-07-26 – 2016-07-28 (×3): 81 mg via ORAL
  Filled 2016-07-24 (×3): qty 1

## 2016-07-24 MED ORDER — DEXTROSE 5 % IV SOLN
1.0000 g | INTRAVENOUS | Status: DC
  Administered 2016-07-25 – 2016-07-26 (×2): 1 g via INTRAVENOUS
  Filled 2016-07-24 (×3): qty 10

## 2016-07-24 MED ORDER — MEMANTINE HCL 10 MG PO TABS
10.0000 mg | ORAL_TABLET | Freq: Two times a day (BID) | ORAL | Status: DC
  Administered 2016-07-25 – 2016-07-28 (×7): 10 mg via ORAL
  Filled 2016-07-24 (×9): qty 1

## 2016-07-24 MED ORDER — FLUTICASONE PROPIONATE 50 MCG/ACT NA SUSP
1.0000 | Freq: Every day | NASAL | Status: DC
Start: 2016-07-25 — End: 2016-07-28
  Administered 2016-07-25 – 2016-07-28 (×4): 1 via NASAL
  Filled 2016-07-24: qty 16

## 2016-07-24 MED ORDER — DONEPEZIL HCL 5 MG PO TABS
10.0000 mg | ORAL_TABLET | Freq: Every day | ORAL | Status: DC
  Administered 2016-07-25 – 2016-07-27 (×4): 10 mg via ORAL
  Filled 2016-07-24 (×4): qty 2

## 2016-07-24 MED ORDER — RISAQUAD PO CAPS
1.0000 | ORAL_CAPSULE | Freq: Every morning | ORAL | Status: DC
  Administered 2016-07-26 – 2016-07-28 (×3): 1 via ORAL
  Filled 2016-07-24 (×4): qty 1

## 2016-07-24 MED ORDER — STARCH (THICKENING) PO POWD
ORAL | Status: DC | PRN
  Filled 2016-07-24: qty 227

## 2016-07-24 MED ORDER — POLYETHYLENE GLYCOL 3350 17 G PO PACK
17.0000 g | PACK | Freq: Every day | ORAL | Status: DC
  Administered 2016-07-27 – 2016-07-28 (×2): 17 g via ORAL
  Filled 2016-07-24 (×3): qty 1

## 2016-07-24 NOTE — Progress Notes (Addendum)
Patient ID: Ellen Hughes, female   DOB: 06/22/1921, 82 y.o.   MRN: [id number]  Addendum: CXR returned showing possible foreign body below the diaphragm.  Abd xray shows no obstruction. Resident has not eaten all day, no further vomiting.  Continues to have mild abd pain and "doesn't feel well"  Will send to the ER for further eval for ingestion of foreign body.   [facility]   Place of Service:  SNF (31) Provider:   Peggye Leyhristy Malana Eberwein, ANP [facility] [phone]   REED, Elmarie ShileyIFFANY, DO  Patient Care Team: Kermit Baloiffany L Reed, DO as PCP - General (Geriatric Medicine) Melvenia NeedlesLeon Cashwell, MD as Consulting Physician (Ophthalmology) Nadara MustardMarcus V Duda, MD as Consulting Physician (Orthopedic Surgery) Donzetta Starchrew Jones, MD as Consulting Physician (Dermatology) Thana FarrLeslie Reynolds, MD as Consulting Physician (Neurology) Fletcher Anonhristina Saraiah Bhat, NP as Nurse Practitioner (Nurse Practitioner)  Extended Emergency Contact Information Primary Emergency Contact: [name] Address: [address]          [place], [place] [address] [place] Home Phone: [phone] Mobile Phone: [phone] Relation: Daughter  Code Status:  DNR Goals of care: Advanced Directive information Advanced Directives 06/24/2016  Does patient have an advance directive? Yes  Type of Advance Directive Healthcare Power of Attorney  Does patient want to make changes to advanced directive? -  Copy of advanced directive(s) in chart? Yes  Pre-existing out of facility DNR order (yellow form or pink MOST form) -     Chief Complaint  Patient presents with  . Acute Visit    pica  . Medical Management of Chronic Issues    HPI:  Pt is a 82 y.o. female seen today acutely for vomiting a whole napkin, as well as medical management of chronic diseases.    The staff reported that she vomited up a napkin today and has not felt like eating.  She is afebrile and denies abd pain. The n/v has subsided. Upon my visit to the room I  noted that there was a bite mark out of her bar of soap.   She has had a cough and sneezing for several weeks that has not responded to zyrtec.  She is currently followed by speech therapy and there is concern for aspiration. There have been no bouts of pna.  MBS was ordered but on hold due to her family wishes to see if the zyrtec worked. Currently she is on a D3 diet with thickened liquids as a trial to see if her cough improves, which it has not thus far.   Her weight is down 5 lbs from last month.  BP elevated today due stress from illness, other numbers wnl. Has a hx of chronic UTI and is on cetin 250 mg qd.  No UTI's in the past year.  Currently on aricept and namenda, MMSE 9/30 09/2015.  Constipation controlled with miralax.   Past Medical History:  Diagnosis Date  . Abnormality of gait and mobility   . Depression   . Gastroesophageal reflux disease without esophagitis   . Hereditary and idiopathic neuropathy   . Hypothyroidism   . Irregular sleep-wake rhythm, nonorganic origin   . Osteoarthritis   . Osteoporosis   . Vertigo   . Vitamin B 12 deficiency    Past Surgical History:  Procedure Laterality Date  . ALLOGRAFT APPLICATION  2008  . CHOLECYSTECTOMY    . COSMETIC SURGERY    . ORIF NONUNION HUMERUS FRACTURE Left    OP1  . t12 vertebroplasty  Allergies  Allergen Reactions  . Codeine   . Morphine And Related       Medication List       Accurate as of 07/24/16 11:16 AM. Always use your most recent med list.          acetaminophen 325 MG tablet Commonly known as:  TYLENOL Take 650 mg by mouth. Take 2 every 6 hours as needed for pain   ALIGN 4 MG Caps Take 4 mg by mouth. Take one each morning   aspirin 81 MG chewable tablet Chew by mouth daily.   Calcium Carbonate-Vitamin D 600-400 MG-UNIT tablet Take 1 tablet by mouth daily.   cefUROXime 250 MG tablet Commonly known as:  CEFTIN Take 250 mg by mouth. Take one tablet each morning   donepezil 10  MG tablet Commonly known as:  ARICEPT Take 10 mg by mouth at bedtime. For memory   fluticasone 50 MCG/ACT nasal spray Commonly known as:  FLONASE Place 1 spray into both nostrils. Twice daily   hydrochlorothiazide 12.5 MG capsule Commonly known as:  MICROZIDE Take 12.5 mg by mouth daily.   levothyroxine 75 MCG tablet Commonly known as:  SYNTHROID, LEVOTHROID Take 75 mcg by mouth daily before breakfast.   memantine 10 MG tablet Commonly known as:  NAMENDA Take 10 mg by mouth 2 (two) times daily.   polyethylene glycol packet Commonly known as:  MIRALAX / GLYCOLAX Take 17 g by mouth daily.   pramipexole 0.25 MG tablet Commonly known as:  MIRAPEX Take 0.25 mg by mouth. Take one tablet at bedtime   sertraline 50 MG tablet Commonly known as:  ZOLOFT Take 50 mg by mouth daily.   vitamin B-12 1000 MCG tablet Commonly known as:  CYANOCOBALAMIN Take 1,000 mcg by mouth daily.   Vitamin D3 2000 units Tabs Take by mouth. Take one each morning       Review of Systems  Constitutional: Positive for appetite change. Negative for activity change, chills, diaphoresis, fatigue, fever and unexpected weight change.  HENT: Positive for congestion, postnasal drip, rhinorrhea, sneezing and trouble swallowing. Negative for dental problem, ear discharge, ear pain, facial swelling, hearing loss, mouth sores, nosebleeds, sinus pressure, sore throat and tinnitus.   Respiratory: Negative for cough, shortness of breath and wheezing.   Cardiovascular: Negative for chest pain, palpitations and leg swelling.  Gastrointestinal: Positive for nausea and vomiting. Negative for abdominal distention, abdominal pain, constipation and diarrhea.  Genitourinary: Negative for difficulty urinating and dysuria.  Musculoskeletal: Positive for arthralgias, back pain and gait problem. Negative for joint swelling and myalgias.  Neurological: Negative for dizziness, tremors, seizures, syncope, facial asymmetry, speech  difficulty, weakness, light-headedness, numbness and headaches.  Psychiatric/Behavioral: Positive for confusion. Negative for agitation, behavioral problems and dysphoric mood.    Immunization History  Administered Date(s) Administered  . Influenza-Unspecified 09/13/2015  . Zoster 07/21/2012   Pertinent  Health Maintenance Due  Topic Date Due  . DEXA SCAN  08/07/1988  . PNA vac Low Risk Adult (1 of 2 - PCV13) 08/07/1988  . INFLUENZA VACCINE  07/01/2016   Fall Risk  03/07/2015  Falls in the past year? Yes  Number falls in past yr: 1   Functional Status Survey:   Wt Readings from Last 3 Encounters:  07/24/16 195 lb (88.5 kg)  07/14/16 195 lb (88.5 kg)  06/24/16 200 lb (90.7 kg)   Vitals:   07/24/16 1126  BP: (!) 154/82  Pulse: 79  Resp: 17  Temp: (!) 96.9 F (36.1 C)  SpO2: 91%  Weight: 195 lb (88.5 kg)   Body mass index is 23.74 kg/m. Physical Exam  Constitutional: No distress.  HENT:  Head: Normocephalic and atraumatic.  Right Ear: External ear normal.  Left Ear: External ear normal.  Mouth/Throat: No oropharyngeal exudate.  Clear drainage to right nare  Eyes: Conjunctivae are normal. Pupils are equal, round, and reactive to light. Right eye exhibits no discharge. Left eye exhibits no discharge.  Neck: No JVD present.  Cardiovascular: Normal rate and regular rhythm.   No murmur heard. Pulmonary/Chest: Effort normal. No respiratory distress. She has wheezes (RUL).  Abdominal: Soft. Bowel sounds are normal. She exhibits no distension. There is no tenderness.  Musculoskeletal: She exhibits no edema or tenderness.  Lymphadenopathy:    She has no cervical adenopathy.  Neurological: She is alert.  Oriented to self only  Skin: Skin is warm and dry. She is not diaphoretic.  Psychiatric: She has a normal mood and affect.  Nursing note and vitals reviewed.   Labs reviewed:  Recent Labs  03/03/16  NA 144  K 4.6  BUN 14  CREATININE 0.8   No results for  input(s): AST, ALT, ALKPHOS, BILITOT, PROT, ALBUMIN in the last 8760 hours.  Recent Labs  05/02/16 0300  WBC 7.8  HGB 13.4  HCT 45  PLT 322   Lab Results  Component Value Date   TSH 2.63 05/24/2015   No results found for: HGBA1C No results found for: CHOL, HDL, LDLCALC, LDLDIRECT, TRIG, CHOLHDL  Significant Diagnostic Results in last 30 days:  No results found.  Assessment/Plan  1. Pica -most likely due to dementia, will check iron panel to rule out other causes -will need to remove items from her room that she is likely to swallow, modify her tray at meals, and keep her up at the nsg station for further monitoring  2. Nausea/Vomiting -resolved -check abd xray and chest xray to rule out other objects ingested, and signs of obstruction or asp pna  3.  Chronic cough -multifactorial -??aspiration, ingesting inappropriate items, also has rhinitis -continue zyrtec and speech therapy -hopefully avoidance of this behavior through frequent monitoring will help  With this issue.  4. Essential hypertension Controlled Continue hctz  5. Allergic rhinitis, unspecified allergic rhinitis type Continue zyrtec 5 mg qhs  6. Chronic UTI Stable  Continue ceftin 250 mg qd   Family/ staff Communication: discussed with staff and nsg supervisor  Labs/tests ordered:  CBC, BMP, iron panel, abd xray

## 2016-07-24 NOTE — ED Notes (Signed)
Pt urinated in her brief, will try a bedpan later for a urine sample.

## 2016-07-24 NOTE — ED Notes (Signed)
Bed: [id number] Expected date:  Expected time:  Means of arrival:  Comments: EMS foreign object

## 2016-07-24 NOTE — ED Triage Notes (Signed)
[facility], staff there states pt had x4 episodes of emesis producing napkins and upon xray foreign bodies observed in stomach. EMS administered x1 albuterol treatment for wheezing. Pt has hx of dementia.

## 2016-07-24 NOTE — ED Provider Notes (Signed)
[facility]-EMERGENCY DEPT Provider Note   CSN: [id number] Arrival date & time: 07/24/16  1551     History   Chief Complaint Chief Complaint  Patient presents with  . Ingestion    Foreign Object    HPI Ellen Hughes is a 82 y.o. female.  The history is provided by the patient and the nursing home. No language interpreter was used.  Ingestion    Ellen Hughes is a 82 y.o. female who presents to the Emergency Department complaining of ingestion.  Level V caveat due to dementia.  Per report she is sent for evaluation following ingestion.  Pt denies any complaints and is unsure why she is here.  [facility]  CXR from today with central pulmonary venous congestion without overt pulmonary edema.  KUB with nonspecific bowel gas pattern, findings would supporr the clinical  diagnosis of constipation.  She was sent due to complaint of abdominal pain, nausea, vomiting, decreased appetitie, has pica, question of foreign body ingestion. This morning she vomited a kleenex and it was noted that there were teeth marks on her soap dish.  For the last two weeks she has had an increased cough.  Earlier today she was complaining of abdominal pain and has not been wanting to eat.    Daughter, melody barker [phone] or cell [phone]  Past Medical History:  Diagnosis Date  . Abnormality of gait and mobility   . Depression   . Gastroesophageal reflux disease without esophagitis   . Hereditary and idiopathic neuropathy   . Hypothyroidism   . Irregular sleep-wake rhythm, nonorganic origin   . Osteoarthritis   . Osteoporosis   . Vertigo   . Vitamin B 12 deficiency     Patient Active Problem List   Diagnosis Date Noted  . Chronic cough 06/29/2016  . Edema extremities 03/27/2016  . HTN (hypertension) 02/26/2016  . Allergic rhinitis 10/22/2015  . H/O: CVA (cerebrovascular accident) 10/22/2015  . B12 deficiency 07/26/2015  . Senile osteoporosis 07/26/2015  .  Abnormality of gait 07/26/2015  . Wheezing 07/26/2015  . Right foot drop 07/26/2015  . Depression 06/14/2015  . Chronic UTI 03/07/2015  . Dementia without behavioral disturbance 03/07/2015  . Vitamin D deficiency 03/07/2015  . Hypothyroidism 03/07/2015  . Osteoarthritis 03/07/2015    Past Surgical History:  Procedure Laterality Date  . ALLOGRAFT APPLICATION  2008  . CHOLECYSTECTOMY    . COSMETIC SURGERY    . ORIF NONUNION HUMERUS FRACTURE Left    OP1  . t12 vertebroplasty      OB History    No data available       Home Medications    Prior to Admission medications   Medication Sig Start Date End Date Taking? Authorizing Provider  acetaminophen (TYLENOL) 325 MG tablet Take 650 mg by mouth. Take 2 every 6 hours as needed for pain    Historical Provider, MD  aspirin 81 MG chewable tablet Chew by mouth daily.    Historical Provider, MD  Calcium Carbonate-Vitamin D 600-400 MG-UNIT per tablet Take 1 tablet by mouth daily.    Historical Provider, MD  cefUROXime (CEFTIN) 250 MG tablet Take 250 mg by mouth. Take one tablet each morning    Historical Provider, MD  Cholecalciferol (VITAMIN D3) 2000 UNITS TABS Take by mouth. Take one each morning    Historical Provider, MD  donepezil (ARICEPT) 10 MG tablet Take 10 mg by mouth at bedtime. For memory    Historical Provider, MD  fluticasone (  FLONASE) 50 MCG/ACT nasal spray Place 1 spray into both nostrils. Twice daily    Historical Provider, MD  hydrochlorothiazide (MICROZIDE) 12.5 MG capsule Take 12.5 mg by mouth daily.    Historical Provider, MD  levothyroxine (SYNTHROID, LEVOTHROID) 75 MCG tablet Take 75 mcg by mouth daily before breakfast.    Historical Provider, MD  memantine (NAMENDA) 10 MG tablet Take 10 mg by mouth 2 (two) times daily.    Historical Provider, MD  polyethylene glycol (MIRALAX / GLYCOLAX) packet Take 17 g by mouth daily.    Historical Provider, MD  pramipexole (MIRAPEX) 0.25 MG tablet Take 0.25 mg by mouth. Take one  tablet at bedtime    Historical Provider, MD  Probiotic Product (ALIGN) 4 MG CAPS Take 4 mg by mouth. Take one each morning    Historical Provider, MD  sertraline (ZOLOFT) 50 MG tablet Take 50 mg by mouth daily.    Historical Provider, MD  vitamin B-12 (CYANOCOBALAMIN) 1000 MCG tablet Take 1,000 mcg by mouth daily.     Historical Provider, MD    Family History Family History  Problem Relation Age of Onset  . Cancer Mother     pancreatic     Social History Social History  Substance Use Topics  . Smoking status: Never Smoker  . Smokeless tobacco: Never Used  . Alcohol use Yes     Comment: social only     Allergies   Codeine and Morphine and related   Review of Systems Review of Systems  Unable to perform ROS: Dementia     Physical Exam Updated Vital Signs BP 143/65 (BP Location: Left Arm)   Pulse 87   Temp 97.6 F (36.4 C) (Oral)   Resp 16   SpO2 (!) 88% Comment: RN notified  Physical Exam  Constitutional: She appears well-developed and well-nourished.  HENT:  Head: Normocephalic and atraumatic.  Cardiovascular: Normal rate and regular rhythm.   No murmur heard. Pulmonary/Chest: Effort normal and breath sounds normal. No respiratory distress.  Abdominal: Soft. There is no tenderness. There is no rebound and no guarding.  Musculoskeletal: She exhibits no tenderness.  Nonpitting edema of BLE  Neurological: She is alert.  Pleasantly confused.  Disoriented to place and time.    Skin: Skin is warm and dry.  Psychiatric: She has a normal mood and affect. Her behavior is normal.  Nursing note and vitals reviewed.    ED Treatments / Results  Labs (all labs ordered are listed, but only abnormal results are displayed) Labs Reviewed - No data to display  EKG  EKG Interpretation None       Radiology No results found.  Procedures Procedures (including critical care time)  Medications Ordered in ED Medications - No data to display   Initial Impression  / Assessment and Plan / ED Course  I have reviewed the triage vital signs and the nursing notes.  Pertinent labs & imaging results that were available during my care of the patient were reviewed by me and considered in my medical decision making (see chart for details).  Clinical Course   [facility] of vomiting, concern for foreign body ingestion. Patient is markedly demented which limits history and examination. It is not clear that she is truly ingesting foreign objects as it would be difficult for her to obtain objects to ingest. CT scan obtained given her history of vomiting and she has had vomiting in the emergency Department. Scan is concerning for pneumonia, will treat  with antibiotics given her new oxygen requirement. Hospitalist consulted for further treatment.   Final Clinical Impressions(s) / ED Diagnoses   Final diagnoses:  CAP (community acquired pneumonia)    New Prescriptions New Prescriptions   No medications on file     Tilden Fossa, MD 07/25/16 [phone]

## 2016-07-25 ENCOUNTER — Inpatient Hospital Stay (HOSPITAL_COMMUNITY): Payer: Medicare Other

## 2016-07-25 DIAGNOSIS — F0390 Unspecified dementia without behavioral disturbance: Secondary | ICD-10-CM

## 2016-07-25 DIAGNOSIS — F5089 Other specified eating disorder: Secondary | ICD-10-CM

## 2016-07-25 LAB — HIV ANTIBODY (ROUTINE TESTING W REFLEX): HIV SCREEN 4TH GENERATION: NONREACTIVE

## 2016-07-25 MED ORDER — SODIUM CHLORIDE 0.9 % IV SOLN
INTRAVENOUS | Status: DC
  Administered 2016-07-25: 1000 mL via INTRAVENOUS
  Administered 2016-07-25: 75 mL/h via INTRAVENOUS

## 2016-07-25 MED ORDER — ONDANSETRON HCL 4 MG/2ML IJ SOLN
4.0000 mg | Freq: Once | INTRAMUSCULAR | Status: AC
  Administered 2016-07-25: 4 mg via INTRAVENOUS
  Filled 2016-07-25: qty 2

## 2016-07-25 MED ORDER — ONDANSETRON HCL 4 MG/2ML IJ SOLN: 4.0000 mg | Freq: Four times a day (QID) | INTRAMUSCULAR | Status: DC | PRN

## 2016-07-25 MED ORDER — SENNOSIDES-DOCUSATE SODIUM 8.6-50 MG PO TABS
1.0000 | ORAL_TABLET | Freq: Two times a day (BID) | ORAL | Status: DC
  Administered 2016-07-25 – 2016-07-28 (×5): 1 via ORAL
  Filled 2016-07-25 (×6): qty 1

## 2016-07-25 NOTE — Consult Note (Signed)
SPEECH PATHOLOGY NOTE MODIFIED BARIUM SWALLOW 07/25/16 @ 1400  No penetration or aspiration of any consistency noted.  Esophageal sweep revealed slow clearing, increasing aspiration risk after the swallow.   Recommend soft solids with chopped meats, thin liquids via cup or straw. Based on performance today, thickener is not needed at this time.   Recommend adherence to safe swallow precautions and strategies for managment of esophageal dysmotility. Pt may benefit from cueing during the meal, given decreased recall.   Recommend consideration of regular barium swallow to assess esophageal motility and identify appropriate course of action.  Full report to follow under imaging. MD paged with results.  Celia B. Bueche, MSP, CCC-SLP [phone]

## 2016-07-25 NOTE — Evaluation (Signed)
Clinical/Bedside Swallow Evaluation Patient Details  Name: Ellen Hughes MRN: [id number] Date of Birth: 09/14/1921  Today's Date: 07/25/2016 Time: SLP Start Time (ACUTE ONLY): 0915 SLP Stop Time (ACUTE ONLY): 0956 SLP Time Calculation (min) (ACUTE ONLY): 41 min  Past Medical History:  Past Medical History:  Diagnosis Date  . Abnormality of gait and mobility   . Depression   . Gastroesophageal reflux disease without esophagitis   . Hereditary and idiopathic neuropathy   . Hypothyroidism   . Irregular sleep-wake rhythm, nonorganic origin   . Osteoarthritis   . Osteoporosis   . Vertigo   . Vitamin B 12 deficiency    Past Surgical History:  Past Surgical History:  Procedure Laterality Date  . ALLOGRAFT APPLICATION  2008  . CHOLECYSTECTOMY    . COSMETIC SURGERY    . ORIF NONUNION HUMERUS FRACTURE Left    OP1  . t12 vertebroplasty     HPI:  82 yo [facility] due to concerns for pneumonia.  Daughter reports this is her mother's first pna.  Pt with PMH + for dementia, GERD without esophagitis, fall with broken arm, decreased mobility status.  Per daughter, pt has had cough since July of this year [facility].  Pt diet changed to nectar on 8/21 but daughter reports no improvement with cough during intake.  Daughter reports mother normally feeds herself.  Good appetitie until the last few weeks reported.  Due to pt's dementia, her historical ability is limited at best.  Evaluation ordered in hospital.     Assessment / Plan / Recommendation Clinical Impression  Pt CN exam unremarkable and pt can self feed without difficulties.  Swallow was timely with adequate mastication and no oral residuals.  No immediate coughing observed and voice was clear.  However after minimal intake of water, crackers, applesauce - pt presented with delayed congested coughing (face turning red).  Suspect possible esophageal deficits as daughter also states pt will  cough up frothy secretions at times.  SLP can not rule out pharyngeal deficits with h/o dementia.  Educated daughter and pt to multiple risk factors for aspiration pneumonias including decreased mobility.  Also advised to potential swallow, gustatory changes with dementia and mitigation strategies.  As daughter reports concerns re: diet limitation and aspiration, will proceed with MBS this pm.  Order placed, test scheduled and daughter/RN educated and agree.  Thanks for this consult.     Aspiration Risk  Mild aspiration risk    Diet Recommendation NPO (needed meds with nectar )        Other  Recommendations   tbd  Follow up Recommendations    tbd   Frequency and Duration   tbd         Prognosis    tbd    Swallow Study   General Date of Onset: 07/18/16 HPI: 82 yo female [facility] due to concerns for pneumonia.  Daughter reports this is her mother's first pna.  Pt with PMH + for dementia, GERD without esophagitis, fall with broken arm, decreased mobility status.  Per daughter, pt has had cough since July of this year and [facility] possible aspiration.  Pt diet changed to nectar on 8/21 but daughter reports no improvement with cough during intake.  Daughter reports mother normally feeds herself.  Good appetitie until the last few weeks reported.  Due to pt's dementia, her historical ability is limited at best.  Evaluation ordered in hospital.   Type of Study: Bedside Swallow Evaluation Diet Prior to this Study: NPO Temperature Spikes Noted: No Respiratory Status: Nasal cannula Behavior/Cognition: Alert;Cooperative Oral Cavity Assessment:  (whitish coating on tongue) Oral Care Completed by SLP: No Oral Cavity - Dentition: Adequate natural dentition Vision: Functional for self-feeding Self-Feeding Abilities: Able to feed self Patient Positioning: Upright in bed Baseline Vocal Quality: Normal Volitional Cough: Strong Volitional Swallow: Able to elicit     Oral/Motor/Sensory Function Overall Oral Motor/Sensory Function: Within functional limits   Ice Chips Ice chips: Not tested Other Comments: pt does not like ice   Thin Liquid Thin Liquid: Within functional limits Presentation: Cup;Self Fed    Nectar Thick Nectar Thick Liquid: Within functional limits Presentation: Cup;Self Fed   Honey Thick Honey Thick Liquid: Not tested   Puree Puree: Impaired Presentation: Self Fed;Spoon Pharyngeal Phase Impairments: Cough - Delayed   Solid   GO   Solid: Impaired Presentation: Self Fed Pharyngeal Phase Impairments: Cough - Delayed        Mills Koller, MS [facility] SLP [phone]

## 2016-07-25 NOTE — [place] FL2 (Signed)
[place] MEDICAID FL2 LEVEL OF CARE SCREENING TOOL     IDENTIFICATION  Patient Name: Ellen Hughes Birthdate: Dec 02, 1920 Sex: female Admission Date (Current Location): 07/24/2016  [facility] and [place] Number:  Producer, television/film/video and Address:  [facility],  [address], [place] [address]      Provider Number: [id number]  Attending Physician Name and Address:  Narda Bonds, MD  Relative Name and Phone Number:       Current Level of Care: Hospital Recommended Level of Care: Skilled Nursing Facility Prior Approval Number:    Date Approved/Denied:   PASRR Number:    Discharge Plan: SNF    Current Diagnoses: Patient Active Problem List   Diagnosis Date Noted  . Pica 07/25/2016  . CAP (community acquired pneumonia) 07/24/2016  . Chronic cough 06/29/2016  . Edema extremities 03/27/2016  . HTN (hypertension) 02/26/2016  . Allergic rhinitis 10/22/2015  . H/O: CVA (cerebrovascular accident) 10/22/2015  . B12 deficiency 07/26/2015  . Senile osteoporosis 07/26/2015  . Abnormality of gait 07/26/2015  . Wheezing 07/26/2015  . Right foot drop 07/26/2015  . Depression 06/14/2015  . Chronic UTI 03/07/2015  . Dementia without behavioral disturbance 03/07/2015  . Vitamin D deficiency 03/07/2015  . Hypothyroidism 03/07/2015  . Osteoarthritis 03/07/2015    Orientation RESPIRATION BLADDER Height & Weight     Self, Situation, Place  O2 Incontinent Weight:   Height:     BEHAVIORAL SYMPTOMS/MOOD NEUROLOGICAL BOWEL NUTRITION STATUS  Other (Comment) (no behaviors)   Incontinent Diet  AMBULATORY STATUS COMMUNICATION OF NEEDS Skin   Total Care Verbally Normal                       Personal Care Assistance Level of Assistance  Total care           Functional Limitations Info  Sight, Hearing, Speech Sight Info: Adequate Hearing Info: Adequate Speech Info: Adequate    SPECIAL CARE FACTORS FREQUENCY                       Contractures  Contractures Info: Not present    Additional Factors Info  Code Status Code Status Info: DNR             Current Medications (07/25/2016):  This is the current hospital active medication list Current Facility-Administered Medications  Medication Dose Route Frequency Provider Last Rate Last Dose  . 0.9 %  sodium chloride infusion   Intravenous Continuous Hillary Bow, DO 75 mL/hr at 07/25/16 1420 75 mL/hr at 07/25/16 1420  . acetaminophen (TYLENOL) tablet 650 mg  650 mg Oral Q6H PRN Hillary Bow, DO      . acidophilus (RISAQUAD) capsule 1 capsule  1 capsule Oral q morning - 10a Hillary Bow, DO      . aspirin chewable tablet 81 mg  81 mg Oral Daily Hillary Bow, DO      . azithromycin [facility]) tablet 500 mg  500 mg Oral Q24H Jared M Gardner, DO      . cefTRIAXone (ROCEPHIN) 1 g in dextrose 5 % 50 mL IVPB  1 g Intravenous Q24H Hillary Bow, DO      . donepezil (ARICEPT) tablet 10 mg  10 mg Oral QHS Hillary Bow, DO   10 mg at 07/25/16 0229  . enoxaparin (LOVENOX) injection 40 mg  40 mg Subcutaneous Q24H Hillary Bow, DO   40 mg at 07/25/16 1025  .  fluticasone (FLONASE) 50 MCG/ACT nasal spray 1 spray  1 spray Each Nare Daily Hillary BowJared M Gardner, DO   1 spray at 07/25/16 1025  . food thickener (THICK IT) powder   Oral PRN Hillary BowJared M Gardner, DO      . levothyroxine (SYNTHROID, LEVOTHROID) tablet 88 mcg  88 mcg Oral QAC breakfast Hillary BowJared M Gardner, DO   88 mcg at 07/25/16 [id number]  . loratadine (CLARITIN) tablet 10 mg  10 mg Oral Daily Hillary BowJared M Gardner, DO      . memantine [facility](NAMENDA) tablet 10 mg  10 mg Oral BID Hillary BowJared M Gardner, DO   10 mg at 07/25/16 0229  . ondansetron (ZOFRAN) injection 4 mg  4 mg Intravenous Q6H PRN Hillary BowJared M Gardner, DO      . polyethylene glycol (MIRALAX / GLYCOLAX) packet 17 g  17 g Oral Daily Hillary BowJared M Gardner, DO      . pramipexole (MIRAPEX) tablet 0.25 mg  0.25 mg Oral TID Hillary BowJared M Gardner, DO   0.25 mg at 07/25/16 0230  . senna-docusate (Senokot-S) tablet 1  tablet  1 tablet Oral BID Hillary BowJared M Gardner, DO   1 tablet at 07/25/16 0229  . sertraline (ZOLOFT) tablet 50 mg  50 mg Oral Daily Hillary BowJared M Gardner, DO         Discharge Medications: Please see discharge summary for a list of discharge medications.  Relevant Imaging Results:  Relevant Lab Results:   Additional Information SS # [id number]  Zaiyah Sottile, Dickey GaveJamie Lee, LCSW

## 2016-07-25 NOTE — H&P (Signed)
History and Physical    Ellen Hughes:[id number] DOB: Jul 14, 1921 DOA: 07/24/2016   PCP: Bufford Spikes, DO Chief Complaint:  Chief Complaint  Patient presents with  . Ingestion    Foreign Object    HPI: Ellen Hughes is a 82 y.o. female with medical history significant of dementia.  Patient presents to the ED after a couple of episodes of vomiting at the SNF today.  Pt denies any complaints and is unsure why she is here.  She is a resident of [facility].  CXR from today with central pulmonary venous congestion without overt pulmonary edema.  KUB with nonspecific bowel gas pattern, findings would supporr the clinical  diagnosis of constipation.  She was sent due to complaint of abdominal pain, nausea, vomiting, decreased appetitie, she has no history of PICA but there is some question of foreign body ingestion by NH staff.  This morning she vomited a kleenex and it was noted that there were teeth marks on her soap dish (see NP note from NH from today).  For the last two weeks she has had an increased cough and there has been concern for aspiration.  Earlier today she was complaining of abdominal pain and has not been wanting to eat.  ED Course: CT abd/pelvis didn't demonstrate any obvious foreign bodies in GI tract.  Did show a consolidation in lung that is suspicious for PNA.  Patient is denying pain anywhere right now.  Review of Systems: As per HPI otherwise 10 point review of systems negative.    Past Medical History:  Diagnosis Date  . Abnormality of gait and mobility   . Depression   . Gastroesophageal reflux disease without esophagitis   . Hereditary and idiopathic neuropathy   . Hypothyroidism   . Irregular sleep-wake rhythm, nonorganic origin   . Osteoarthritis   . Osteoporosis   . Vertigo   . Vitamin B 12 deficiency     Past Surgical History:  Procedure Laterality Date  . ALLOGRAFT APPLICATION  2008  . CHOLECYSTECTOMY    . COSMETIC SURGERY    . ORIF NONUNION  HUMERUS FRACTURE Left    OP1  . t12 vertebroplasty       reports that she has never smoked. She has never used smokeless tobacco. She reports that she drinks alcohol. She reports that she does not use drugs.  Allergies  Allergen Reactions  . Codeine   . Morphine And Related     Family History  Problem Relation Age of Onset  . Cancer Mother     pancreatic       Prior to Admission medications   Medication Sig Start Date End Date Taking? Authorizing Provider  acetaminophen (TYLENOL) 325 MG tablet Take 650 mg by mouth every 6 (six) hours as needed (pain.).    Yes Historical Provider, MD  aspirin 81 MG chewable tablet Chew by mouth daily.   Yes Historical Provider, MD  Calcium Carbonate-Vitamin D 600-400 MG-UNIT per tablet Take 1 tablet by mouth daily.   Yes Historical Provider, MD  cefUROXime (CEFTIN) 250 MG tablet Take 250 mg by mouth. Take one tablet each morning indefinitely.   Yes Historical Provider, MD  cetirizine (ZYRTEC) 5 MG tablet Take 5 mg by mouth every evening.   Yes Historical Provider, MD  Cholecalciferol (VITAMIN D3) 2000 UNITS TABS Take by mouth. Take one each morning   Yes Historical Provider, MD  donepezil (ARICEPT) 10 MG tablet Take 10 mg by mouth at bedtime. For memory   Yes  Historical Provider, MD  fluticasone (FLONASE) 50 MCG/ACT nasal spray Place 1 spray into both nostrils. Twice daily   Yes Historical Provider, MD  food thickener (THICK IT) POWD Take by mouth as needed (one week trial of nectar-thickened liquids until 07/28/16. No straws.).   Yes Historical Provider, MD  hydrochlorothiazide (MICROZIDE) 12.5 MG capsule Take 12.5 mg by mouth daily.   Yes Historical Provider, MD  levothyroxine (SYNTHROID, LEVOTHROID) 88 MCG tablet Take 88 mcg by mouth daily before breakfast.   Yes Historical Provider, MD  memantine (NAMENDA) 10 MG tablet Take 10 mg by mouth 2 (two) times daily.   Yes Historical Provider, MD  polyethylene glycol (MIRALAX / GLYCOLAX) packet Take 17 g  by mouth daily.   Yes Historical Provider, MD  pramipexole (MIRAPEX) 0.25 MG tablet Take 0.25 mg by mouth. Take one tablet at bedtime   Yes Historical Provider, MD  Probiotic Product (ALIGN) 4 MG CAPS Take 4 mg by mouth. Take one each morning   Yes Historical Provider, MD  sertraline (ZOLOFT) 50 MG tablet Take 50 mg by mouth daily.   Yes Historical Provider, MD  vitamin B-12 (CYANOCOBALAMIN) 1000 MCG tablet Take 1,000 mcg by mouth daily.    Yes Historical Provider, MD    Physical Exam: Vitals:   07/24/16 1600 07/24/16 1843 07/24/16 2006 07/24/16 2321  BP: 143/65 141/78 126/73 128/59  Pulse: 87 82 98 89  Resp: 16 20 23 16   Temp: 97.6 F (36.4 C)     TempSrc: Oral     SpO2: (!) 88% 100% 93% 98%      Constitutional: NAD, calm, comfortable Eyes: PERRL, lids and conjunctivae normal ENMT: Mucous membranes are moist. Posterior pharynx clear of any exudate or lesions.Normal dentition.  Neck: normal, supple, no masses, no thyromegaly Respiratory: clear to auscultation bilaterally, no wheezing, no crackles. Normal respiratory effort. No accessory muscle use.  Cardiovascular: Regular rate and rhythm, no murmurs / rubs / gallops. No extremity edema. 2+ pedal pulses. No carotid bruits.  Abdomen: no tenderness, no masses palpated. No hepatosplenomegaly. Bowel sounds positive.  Musculoskeletal: no clubbing / cyanosis. No joint deformity upper and lower extremities. Good ROM, no contractures. Normal muscle tone.  Skin: no rashes, lesions, ulcers. No induration Neurologic: CN 2-12 grossly intact. Sensation intact, DTR normal. Strength 5/5 in all 4.  Psychiatric: oriented to self only, pleasantly confused, smiling, denies pain, isnt sure why she is here.   Labs on Admission: I have personally reviewed following labs and imaging studies  CBC:  Recent Labs Lab 07/24/16 1638  WBC 9.6  NEUTROABS 6.4  HGB 14.6  HCT 46.3*  MCV 98.7  PLT 374   Basic Metabolic Panel:  Recent Labs Lab  07/24/16 1638  NA 143  K 3.5  CL 100*  CO2 35*  GLUCOSE 114*  BUN 18  CREATININE 1.00  CALCIUM 10.1   GFR: CrCl cannot be calculated (Unknown ideal weight.). Liver Function Tests:  Recent Labs Lab 07/24/16 1638  AST 21  ALT 20  ALKPHOS 93  BILITOT 0.5  PROT 7.7  ALBUMIN 3.8    Recent Labs Lab 07/24/16 1638  LIPASE 18   No results for input(s): AMMONIA in the last 168 hours. Coagulation Profile: No results for input(s): INR, PROTIME in the last 168 hours. Cardiac Enzymes: No results for input(s): CKTOTAL, CKMB, CKMBINDEX, TROPONINI in the last 168 hours. BNP (last 3 results) No results for input(s): PROBNP in the last 8760 hours. HbA1C: No results for input(s): HGBA1C in the  last 72 hours. CBG: No results for input(s): GLUCAP in the last 168 hours. Lipid Profile: No results for input(s): CHOL, HDL, LDLCALC, TRIG, CHOLHDL, LDLDIRECT in the last 72 hours. Thyroid Function Tests: No results for input(s): TSH, T4TOTAL, FREET4, T3FREE, THYROIDAB in the last 72 hours. Anemia Panel: No results for input(s): VITAMINB12, FOLATE, FERRITIN, TIBC, IRON, RETICCTPCT in the last 72 hours. Urine analysis:    Component Value Date/Time   COLORURINE YELLOW 05/21/2011 2101   APPEARANCEUR CLOUDY (A) 05/21/2011 2101   LABSPEC 1.036 (H) 05/21/2011 2101   PHURINE 5.5 05/21/2011 2101   GLUCOSEU 100 (A) 05/21/2011 2101   HGBUR NEGATIVE 05/21/2011 2101   BILIRUBINUR SMALL (A) 05/21/2011 2101   KETONESUR NEGATIVE 05/21/2011 2101   PROTEINUR NEGATIVE 05/21/2011 2101   UROBILINOGEN 0.2 05/21/2011 2101   NITRITE NEGATIVE 05/21/2011 2101   LEUKOCYTESUR SMALL (A) 05/21/2011 2101   Sepsis Labs: @LABRCNTIP (procalcitonin:4,lacticidven:4) )No results found for this or any previous visit (from the past 240 hour(s)).   Radiological Exams on Admission: Ct Abdomen Pelvis Wo Contrast  Result Date: 07/24/2016 CLINICAL DATA:  Vomiting. EXAM: CT ABDOMEN AND PELVIS WITHOUT CONTRAST TECHNIQUE:  Multidetector CT imaging of the abdomen and pelvis was performed following the standard protocol without IV contrast. COMPARISON:  None. FINDINGS: Lower chest and abdominal wall: Dense subpleural ovoid opacity in the left lower lobe which measures at maximal 4 cm. Hepatobiliary: No focal liver abnormality. Calcification near the bare area. Cholecystectomy clips. Pancreas: Unremarkable. Spleen: Unremarkable. Adrenals/Urinary Tract: Negative adrenals. Malrotated right kidney with extrarenal pelvis. Patulous right ureter without ureteral stone. There is right nephrolithiasis with calculi measuring up to 6 mm. Indistinct thick walled bladder circumferentially. Stomach/Bowel: Prominent rectal stool burden with trace mesorectal fat edema. No obstruction. Diverticulosis without diverticulitis. No appendicitis. Reproductive:The vagina is anteriorly displaced and contains fluid. The endometrial cavity is gas-filled and distended. The rectum is in close continuity to the vagina and posterior uterine body. Vascular/Lymphatic: Diffuse atherosclerotic calcification No mass or adenopathy. Other: No ascites or pneumoperitoneum. Musculoskeletal: Advanced and diffuse disc and facet degeneration with lumbar levoscoliosis. Remote T12 compression fracture with moderate retropulsion. Status post vertebroplasty. IMPRESSION: 1. Cystitis. 2. Gas distended endometrial cavity and fluid in the upper vagina. This implies a rectovaginal/uterine fistula. 3. 4 cm opacity in the subpleural left lower lobe could reflect pneumonia or mass. If pneumonia symptoms followup PA and lateral chest X-ray is recommended in 3-4 weeks following trial of antibiotic therapy to ensure resolution and exclude underlying malignancy. 4. Rectal distention with stool, possible developing impaction. 5. Right nephrolithiasis. Electronically Signed   By: Marnee Spring M.D.   On: 07/24/2016 22:23   Dg Chest 2 View  Result Date: 07/24/2016 CLINICAL DATA:  Cough,  ingested foreign body EXAM: CHEST  2 VIEW COMPARISON:  CT chest dated 10/06/2007 FINDINGS: Increased interstitial markings. Mild bibasilar opacities, likely atelectasis. No focal consolidation. No pleural effusion or pneumothorax. Cardiomegaly. Mild degenerative changes of the visualized thoracolumbar spine. IMPRESSION: No evidence of acute cardiopulmonary disease. Electronically Signed   By: Charline Bills M.D.   On: 07/24/2016 17:16    EKG: Independently reviewed.  Assessment/Plan Principal Problem:   CAP (community acquired pneumonia) Active Problems:   Dementia without behavioral disturbance   Pica    1. CAP - 1. PNA pathway 2. Rocephin / azithromycin 2. ? PICA - 1. Family is dis-believing and dosent see how she could have even gotten to the bar of soap to have taken a bite out of it. 2. No obvious foreign  bodies called on CT abd/pelvis 3. If patient continues to have N/V then would call GI in AM to consider scope. 4. No prior h/o PICA 3. Dementia - continue home meds 4. Possible Aspiration - SLP eval and treat with diet orders. 5. Developing constipation - add Senna to bowel regimen, likely developing due to recent addition of thickener to diet. 6.    DVT prophylaxis: Lovenox Code Status: DNR - yellow form at bedside Family Communication: Daughter at bedside Consults called: None Admission status: Admit to inpatient   Hillary BowGARDNER, JARED M. DO [facility] Pager [phone] from 7PM-7AM  If 7AM-7PM, please contact the day physician for the patient [email or web address] Password [email or web address]  07/25/2016, 12:07 AM

## 2016-07-25 NOTE — Clinical Social Work Note (Signed)
Clinical Social Work Assessment  Patient Details  Name: Ellen Hughes MRN: [id number] Date of Birth: 07-Jun-1921  Date of referral:  07/25/16               Reason for consult:  Discharge Planning, Facility Placement                Permission sought to share information with:  Facility Art therapist granted to share information::  Yes, Verbal Permission Granted  Name::        Agency::     Relationship::     Contact Information:     Housing/Transportation Living arrangements for the past 2 months:  [place] of Information:  Adult Children Patient Interpreter Needed:  None Criminal Activity/Legal Involvement Pertinent to Current Situation/Hospitalization:  No - Comment as needed Significant Relationships:  Adult Children Lives with:  Facility Resident Do you feel safe going back to the place where you live?  Yes Need for family participation in patient care:  Yes (Comment)  Care giving concerns:  No concerns report by family.   Social Worker assessment / plan:  Pt hospitalized on 07/24/16 from [facility] ( SNF ) with CAP. CSW met with pt / daughter at bedside. Daughter expects pt to return to [facility] at d/c. CSW has sent clinicals to SNF. [facility] will readmit pt when she is stable for d/c. CSW will continue to follow to assist with d/c planning needs.  Employment status:  Retired Forensic scientist:  Medicare PT Recommendations:  Not assessed at this time Information / Referral to community resources:     Patient/Family's Response to care:  Family expects pt to return to [facility] at d/c.  Patient/Family's Understanding of and Emotional Response to Diagnosis, Current Treatment, and Prognosis:  Daughter is aware of pt's medical status. Pt is not aware due to cognitive deficits related to dementia. Pt reports that she is comfortable and is receiving good care.  Emotional Assessment Appearance:  Appears stated  age Attitude/Demeanor/Rapport:  Other (cooperative) Affect (typically observed):  Calm, Pleasant Orientation:  Oriented to Self, Oriented to Place, Oriented to Situation Alcohol / Substance use:  Not Applicable Psych involvement (Current and /or in the community):  No (Comment)  Discharge Needs  Concerns to be addressed:  Discharge Planning Concerns Readmission within the last 30 days:  No Current discharge risk:  None Barriers to Discharge:  No Barriers Identified   Luretha Rued, Mc[place] 07/25/2016, 4:16 PM

## 2016-07-25 NOTE — ED Notes (Signed)
MD at bedside. 

## 2016-07-26 ENCOUNTER — Inpatient Hospital Stay (HOSPITAL_COMMUNITY): Payer: Medicare Other

## 2016-07-26 DIAGNOSIS — J9601 Acute respiratory failure with hypoxia: Secondary | ICD-10-CM

## 2016-07-26 LAB — CBC
HCT: 41.6 % (ref 36.0–46.0)
HEMOGLOBIN: 12.7 g/dL (ref 12.0–15.0)
MCH: 31.1 pg (ref 26.0–34.0)
MCHC: 30.5 g/dL (ref 30.0–36.0)
MCV: 101.7 fL — ABNORMAL HIGH (ref 78.0–100.0)
Platelets: 280 10*3/uL (ref 150–400)
RBC: 4.09 MIL/uL (ref 3.87–5.11)
RDW: 14.2 % (ref 11.5–15.5)
WBC: 8.5 10*3/uL (ref 4.0–10.5)

## 2016-07-26 LAB — BASIC METABOLIC PANEL
Anion gap: 7 (ref 5–15)
BUN: 12 mg/dL (ref 6–20)
CALCIUM: 8.8 mg/dL — AB (ref 8.9–10.3)
CHLORIDE: 101 mmol/L (ref 101–111)
CO2: 30 mmol/L (ref 22–32)
CREATININE: 0.71 mg/dL (ref 0.44–1.00)
GFR calc non Af Amer: >60 mL/min (ref >60)
Glucose, Bld: 117 mg/dL — ABNORMAL HIGH (ref 65–99)
Potassium: 3.8 mmol/L (ref 3.5–5.1)
SODIUM: 138 mmol/L (ref 135–145)

## 2016-07-26 MED ORDER — FUROSEMIDE 10 MG/ML IJ SOLN
40.0000 mg | Freq: Once | INTRAMUSCULAR | Status: AC
  Administered 2016-07-26: 40 mg via INTRAVENOUS
  Filled 2016-07-26: qty 4

## 2016-07-26 NOTE — Progress Notes (Signed)
Dr. Caleb PoppNettey aware via phone that pt diuresed large amount of urine post IV lasix ordered yet none since. MD also made aware family questioned whether pt needs foley. MD wants to avoid foley right now. No new orders received at this time. Shared with daughter Dr. Dennison NancyNettey's thoughts around foley insertion and risk of infection with verbalized understanding. Reassurance given.

## 2016-07-26 NOTE — Progress Notes (Signed)
  Pt's  breathing has suddenly become labored, increase restless noted, lungs sound wet with crackles noted 02 is 100% on 2L. Paged Elray McgregorMary Lynch NP order given to decrease IV rate from 75 cc to 10 cc,. And a portable chest xray, pt's family member is at the bedside. Comfort given will continue to monitor.

## 2016-07-26 NOTE — Progress Notes (Signed)
PROGRESS NOTE    Ellen Hughes  XBM:[id number] DOB: [birth year] DOA: 07/24/2016 PCP: Bufford Spikes, DO   Brief Narrative: Ellen Hughes is a 82 year old female with a past medical history significant of dementia, depression, hypothyroidism who presented secondary to episodes of vomiting and found to have a possible pneumonia.   Assessment & Plan:   Principal Problem:   CAP (community acquired pneumonia) Active Problems:   Dementia without behavioral disturbance   Pica   Community acquired pneumonia, likely secondary to aspiration Acute hypoxic respiratory failure Clinically patient has some coughing with sputum production. She has been afebrile. Patient developed some hypoxia overnight and was started on oxygen via nasal cannula at 2 L. Chest x-ray was significant for infiltrate consistent with pneumonia. Speech evaluated and recommended a dysphagia 3 diet. Patient has had an echocardiogram performed in 2008 which showed no heart failure at the time. She is on Lasix palpation for swelling. -Continue ceftriaxone and azithromycin -Give 1 dose of Lasix 40 mg IV -Strict intake and output -Daily weights -Echocardiogram -Continue KVO fluid  Pica Patient is not demonstrated this while admitted. Hemoglobin is within normal limits  Dementia -Continue Namenda -Continue Aricept   DVT prophylaxis: Lovenox subcutaneous Code Status: DO NOT RESUSCITATE  Family Communication: Daughter at bedside  Disposition Plan: Likely discharge back to SNF on Monday once weaned off of oxygen   Consultants:   None  Procedures:  Echocardiogram (8/26)  Antimicrobials:   ceftriaxone (8/25 >>  Azithromycin (8/25>>   Subjective:  patient reports no complaints overnight. Upon chart review, patient was found to have increased work of breathing. Chest x-ray was obtained and was significant for left basilar infiltrate. Fluids were decreased to KVO at that time. Patient has been coughing with some mild  production of sputum. She is currently asymptomatic.  Objective: Vitals:   07/25/16 0638 07/25/16 1338 07/25/16 2134 07/26/16 0630  BP: 129/76 (!) 129/57 137/64 (!) 127/50  Pulse: 85 75 85 84  Resp: 16 18 18 16   Temp: 98.7 F (37.1 C) 97.8 F (36.6 C) 98 F (36.7 C) 97.8 F (36.6 C)  TempSrc: Oral Oral Oral Axillary  SpO2: 95% 98% 99% 98%    Intake/Output Summary (Last 24 hours) at 07/26/16 1405 Last data filed at 07/26/16 1357  Gross per 24 hour  Intake              995 ml  Output                0 ml  Net              995 ml   There were no vitals filed for this visit.  Examination:  General exam: Appears calm and comfortable Respiratory system: Clear to auscultation. Respiratory effort normal. Cardiovascular system: S1 & S2 heard, RRR. No JVD, murmurs, rubs, gallops or clicks. Gastrointestinal system: Abdomen is nondistended, soft and nontender. No organomegaly or masses felt. Normal bowel sounds heard. Central nervous system: Alert and oriented. No focal neurological deficits. Extremities:  no cyanosis, normal muscle bulk, no edema Skin: No rashes, lesions or ulcers Psychiatry: Judgement and insight appear normal. Mood & affect appropriate.     Data Reviewed: I have personally reviewed following labs and imaging studies  CBC:  Recent Labs Lab 07/24/16 1638 07/26/16 1127  WBC 9.6 8.5  NEUTROABS 6.4  --   HGB 14.6 12.7  HCT 46.3* 41.6  MCV 98.7 101.7*  PLT 374 280   Basic Metabolic Panel:  Recent Labs Lab 07/24/16  1638 07/26/16 1127  NA 143 138  K 3.5 3.8  CL 100* 101  CO2 35* 30  GLUCOSE 114* 117*  BUN 18 12  CREATININE 1.00 0.71  CALCIUM 10.1 8.8*   GFR: CrCl cannot be calculated (Unknown ideal weight.). Liver Function Tests:  Recent Labs Lab 07/24/16 1638  AST 21  ALT 20  ALKPHOS 93  BILITOT 0.5  PROT 7.7  ALBUMIN 3.8    Recent Labs Lab 07/24/16 1638  LIPASE 18   No results for input(s): AMMONIA in the last 168  hours. Coagulation Profile: No results for input(s): INR, PROTIME in the last 168 hours. Cardiac Enzymes: No results for input(s): CKTOTAL, CKMB, CKMBINDEX, TROPONINI in the last 168 hours. BNP (last 3 results) No results for input(s): PROBNP in the last 8760 hours. HbA1C: No results for input(s): HGBA1C in the last 72 hours. CBG: No results for input(s): GLUCAP in the last 168 hours. Lipid Profile: No results for input(s): CHOL, HDL, LDLCALC, TRIG, CHOLHDL, LDLDIRECT in the last 72 hours. Thyroid Function Tests: No results for input(s): TSH, T4TOTAL, FREET4, T3FREE, THYROIDAB in the last 72 hours. Anemia Panel: No results for input(s): VITAMINB12, FOLATE, FERRITIN, TIBC, IRON, RETICCTPCT in the last 72 hours. Sepsis Labs: No results for input(s): PROCALCITON, LATICACIDVEN in the last 168 hours.  No results found for this or any previous visit (from the past 240 hour(s)).       Radiology Studies: Ct Abdomen Pelvis Wo Contrast  Result Date: 07/24/2016 CLINICAL DATA:  Vomiting. EXAM: CT ABDOMEN AND PELVIS WITHOUT CONTRAST TECHNIQUE: Multidetector CT imaging of the abdomen and pelvis was performed following the standard protocol without IV contrast. COMPARISON:  None. FINDINGS: Lower chest and abdominal wall: Dense subpleural ovoid opacity in the left lower lobe which measures at maximal 4 cm. Hepatobiliary: No focal liver abnormality. Calcification near the bare area. Cholecystectomy clips. Pancreas: Unremarkable. Spleen: Unremarkable. Adrenals/Urinary Tract: Negative adrenals. Malrotated right kidney with extrarenal pelvis. Patulous right ureter without ureteral stone. There is right nephrolithiasis with calculi measuring up to 6 mm. Indistinct thick walled bladder circumferentially. Stomach/Bowel: Prominent rectal stool burden with trace mesorectal fat edema. No obstruction. Diverticulosis without diverticulitis. No appendicitis. Reproductive:The vagina is anteriorly displaced and  contains fluid. The endometrial cavity is gas-filled and distended. The rectum is in close continuity to the vagina and posterior uterine body. Vascular/Lymphatic: Diffuse atherosclerotic calcification No mass or adenopathy. Other: No ascites or pneumoperitoneum. Musculoskeletal: Advanced and diffuse disc and facet degeneration with lumbar levoscoliosis. Remote T12 compression fracture with moderate retropulsion. Status post vertebroplasty. IMPRESSION: 1. Cystitis. 2. Gas distended endometrial cavity and fluid in the upper vagina. This implies a rectovaginal/uterine fistula. 3. 4 cm opacity in the subpleural left lower lobe could reflect pneumonia or mass. If pneumonia symptoms followup PA and lateral chest X-ray is recommended in 3-4 weeks following trial of antibiotic therapy to ensure resolution and exclude underlying malignancy. 4. Rectal distention with stool, possible developing impaction. 5. Right nephrolithiasis. Electronically Signed   By: Marnee Spring M.D.   On: 07/24/2016 22:23   Dg Chest 2 View  Result Date: 07/24/2016 CLINICAL DATA:  Cough, ingested foreign body EXAM: CHEST  2 VIEW COMPARISON:  CT chest dated 10/06/2007 FINDINGS: Increased interstitial markings. Mild bibasilar opacities, likely atelectasis. No focal consolidation. No pleural effusion or pneumothorax. Cardiomegaly. Mild degenerative changes of the visualized thoracolumbar spine. IMPRESSION: No evidence of acute cardiopulmonary disease. Electronically Signed   By: Charline Bills M.D.   On: 07/24/2016 17:16   Dg  Chest Port 1 View  Result Date: 07/26/2016 CLINICAL DATA:  Dyspnea today. EXAM: PORTABLE CHEST 1 VIEW COMPARISON:  Frontal and lateral views 07/24/2016, included portions from lung bases 07/24/2016 FINDINGS: Low lung volumes. The 4 cm left lower lobe rounded opacity is not seen radiographically. Unchanged right basilar atelectasis, increased left basilar opacity. Heart size and mediastinal contours are unchanged with  thoracic aortic atherosclerosis. No developing pleural effusion or evidence of pneumothorax. Kyphoplasty at the thoracolumbar junction. IMPRESSION: Low lung volumes. Increasing left basilar opacity which may be atelectasis or aspiration. The rounded subpleural opacity in the left lower lobe on CT is not well seen radiographically. Electronically Signed   By: Rubye OaksMelanie  Ehinger M.D.   On: 07/26/2016 03:48   Dg Swallowing Func-speech Pathology  Result Date: 07/25/2016 Objective Swallowing Evaluation: Type of Study: MBS-Modified Barium Swallow Study Patient Details Name: Margit Bandaudrey Haefner MRN: [id number] Date of Birth: 04/02/1921 Today's Date: 07/25/2016 Time: SLP Start Time (ACUTE ONLY): 1345-SLP Stop Time (ACUTE ONLY): 1430 SLP Time Calculation (min) (ACUTE ONLY): 45 min Past Medical History: Past Medical History: Diagnosis Date . Abnormality of gait and mobility  . Depression  . Gastroesophageal reflux disease without esophagitis  . Hereditary and idiopathic neuropathy  . Hypothyroidism  . Irregular sleep-wake rhythm, nonorganic origin  . Osteoarthritis  . Osteoporosis  . Vertigo  . Vitamin B 12 deficiency  Past Surgical History: Past Surgical History: Procedure Laterality Date . ALLOGRAFT APPLICATION  2008 . CHOLECYSTECTOMY   . COSMETIC SURGERY   . ORIF NONUNION HUMERUS FRACTURE Left   OP1 . t12 vertebroplasty   HPI: 82 [facility] concerns for pneumonia.  Daughter reports this is her mother's first pna.  Pt with PMH + for dementia, GERD without esophagitis, fall with broken arm, decreased mobility status.  Per daughter, pt has had cough since July of this year [facility]  Pt diet changed to nectar on 8/21 but daughter reports no improvement with cough during intake.  Daughter reports mother normally feeds herself.  Good appetitie until the last few weeks reported.  Due to pt's dementia, her historical ability is limited at best.  Evaluation ordered in  hospital.   Subjective: Pt seen in radiology for MBS. Daughter present Assessment / Plan / Recommendation CHL IP CLINICAL IMPRESSIONS 07/25/2016 Therapy Diagnosis Mild oral phase dysphagia;Mild pharyngeal phase dysphagia Clinical Impression Mild oropharyngeal dysphagia, characterized orally by piecemeal swallow of larger bolus of puree, which required cue to dry swallow. Pharyngeally, pt exhibited delayed swallow reflex due to decreased sensation for primary trigger site. Trigger noted at the vallecula on puree and solid, and at the pyriform sinus on thin liquid via cup and straw. Post-swallow vallecular residue was noted only on the larger bolus of puree consistency, and cued dry swallow effectively cleared residue. No penetration or aspiration of any consistency noted.  Esophageal sweep revealed slow clearing, increasing aspiration risk after the swallow. Pt's daughter was present during this assessment, and was provided with written dietary and behavioral strategies to manage esophageal dysmotility. Recommend soft solids with chopped meats, thin liquids via cup or straw. Based on performance today, thickener is not needed at this time. Recommend adherence to safe swallow precautions and strategies for managment of esophageal dysmotility. Pt may benefit from cueing during the meal, given decreased recall.  Impact on safety and function Mild aspiration risk   CHL IP TREATMENT RECOMMENDATION 07/25/2016 Treatment Recommendations No treatment recommended at this time  Prognosis 07/25/2016 Prognosis for Safe Diet Advancement Fair Barriers to Reach Goals Cognitive deficits CHL IP DIET RECOMMENDATION 07/25/2016 SLP Diet Recommendations Dysphagia 3 (Mech soft) solids;Thin liquid Liquid Administration via Cup;Straw Medication Administration Whole meds with liquid Compensations Minimize environmental distractions;Slow rate;Small sips/bites;Follow solids with liquid;Multiple dry swallows after each bite/sip Postural Changes  Remain semi-upright after after feeds/meals (Comment);Seated upright at 90 degrees   CHL IP OTHER RECOMMENDATIONS 07/25/2016 Recommended Consults Consider esophageal assessment Oral Care Recommendations Oral care BID   CHL IP FOLLOW UP RECOMMENDATIONS 07/25/2016 Follow up Recommendations 24 hour supervision/assistance    CHL IP ORAL PHASE 07/25/2016 Oral Phase Impaired Oral - Puree Piecemeal swallowing  CHL IP PHARYNGEAL PHASE 07/25/2016 Pharyngeal Phase Impaired Pharyngeal- Thin Cup Delayed swallow initiation-pyriform sinuses Pharyngeal- Thin Straw Delayed swallow initiation-pyriform sinuses Pharyngeal- Puree Delayed swallow initiation-vallecula;Pharyngeal residue - valleculae Pharyngeal- Mechanical Soft Delayed swallow initiation-vallecula  CHL IP CERVICAL ESOPHAGEAL PHASE 07/25/2016 Cervical Esophageal Phase Impaired Leigh Aurora 07/25/2016, 3:05 PM Celia B. Bueche, MSP, CCC-SLP [phone]                   Scheduled Meds: . acidophilus  1 capsule Oral q morning - 10a  . aspirin  81 mg Oral Daily  . azithromycin  500 mg Oral Q24H  . cefTRIAXone (ROCEPHIN)  IV  1 g Intravenous Q24H  . donepezil  10 mg Oral QHS  . enoxaparin (LOVENOX) injection  40 mg Subcutaneous Q24H  . fluticasone  1 spray Each Nare Daily  . furosemide  40 mg Intravenous Once  . levothyroxine  88 mcg Oral QAC breakfast  . loratadine  10 mg Oral Daily  . memantine  10 mg Oral BID  . polyethylene glycol  17 g Oral Daily  . pramipexole  0.25 mg Oral TID  . senna-docusate  1 tablet Oral BID  . sertraline  50 mg Oral Daily   Continuous Infusions: . sodium chloride 10 mL/hr at 07/26/16 0155     LOS: 2 days     Jacquelin Hawking, MD [facility] 07/26/2016, 2:05 PM Pager: [phone]  If 7PM-7AM, please contact night-coverage [email or web address] Password [email or web address] 07/26/2016, 2:05 PM

## 2016-07-27 ENCOUNTER — Inpatient Hospital Stay (HOSPITAL_COMMUNITY): Payer: Medicare Other

## 2016-07-27 LAB — ECHOCARDIOGRAM COMPLETE
HEIGHTINCHES: 68 in
WEIGHTICAEL: 3048 [oz_av]

## 2016-07-27 MED ORDER — AMOXICILLIN 500 MG PO CAPS
500.0000 mg | ORAL_CAPSULE | Freq: Three times a day (TID) | ORAL | Status: DC
  Administered 2016-07-27 – 2016-07-28 (×3): 500 mg via ORAL
  Filled 2016-07-27 (×6): qty 1

## 2016-07-27 MED ORDER — AZITHROMYCIN 500 MG PO TABS
500.0000 mg | ORAL_TABLET | Freq: Every day | ORAL | Status: DC
  Administered 2016-07-27: 500 mg via ORAL
  Filled 2016-07-27: qty 1

## 2016-07-27 NOTE — Progress Notes (Signed)
PT Cancellation Note  Patient Details Name: Margit Bandaudrey Jeangilles MRN: [id number] DOB: 06/04/1921   Cancelled Treatment:    Reason Eval/Treat Not Completed: PT screened, no needs identified, will sign off. Spoke with family-pt is total care at baseline. Staff uses a hoyer lift for transfers/OOB. Pt does not have any acute PT needs. Will sign off.    Rebeca AlertJannie Zylah Elsbernd, MPT Pager: [phone]

## 2016-07-27 NOTE — Progress Notes (Signed)
  Echocardiogram 2D Echocardiogram has been performed.  [name], Ellen Hughes 07/27/2016, 10:58 AM

## 2016-07-27 NOTE — Progress Notes (Signed)
PROGRESS NOTE    Ellen Hughes  WUJ:[id number] DOB: 04-Nov-1921 DOA: 07/24/2016 PCP: Bufford Spikes, DO   Brief Narrative: Ellen Hughes is a 82 year old female with a past medical history significant of dementia, depression, hypothyroidism who presented secondary to episodes of vomiting and found to have a possible pneumonia.   Assessment & Plan:   Principal Problem:   CAP (community acquired pneumonia) Active Problems:   Dementia without behavioral disturbance   Pica   Community acquired pneumonia, possibly secondary to aspiration Acute hypoxic respiratory failure Patient's symptoms yesterday improved with lasix. Echocardiogram pending. She has been afebrile during her admission. She has not been covered for aspiration and is improving well. Blood culture with no growth x2 days -Transition to amoxicillin and azithromycin PO -Daily weights -follow-up Echocardiogram -Continue KVO fluid -wean off of O2  Pica Patient has not demonstrated this while admitted. Hemoglobin is within normal limits  Dementia -Continue Namenda -Continue Aricept   DVT prophylaxis: Lovenox subcutaneous Code Status: DO NOT RESUSCITATE  Family Communication: Daughter at bedside  Disposition Plan: Likely discharge back to SNF tomorrow once weaned off of oxygen   Consultants:   None  Procedures:  Echocardiogram (8/26)  Antimicrobials:  Ceftriaxone (8/25 >>8/26  Azithromycin (8/25>>  Amoxicillin (8/26>>   Subjective: No complaints today. Breathing is better.   Objective: Vitals:   07/26/16 1536 07/26/16 2214 07/26/16 2300 07/27/16 0617  BP: 102/68 (!) 142/51  121/64  Pulse:  86  86  Resp:  18  17  Temp:  98 F (36.7 C)  98.1 F (36.7 C)  TempSrc:  Oral  Oral  SpO2:  95%  98%  Weight:  86.4 kg (190 lb 8 oz)    Height:   5\' 8"  (1.727 m)     Intake/Output Summary (Last 24 hours) at 07/27/16 1410 Last data filed at 07/27/16 1014  Gross per 24 hour  Intake              510 ml    Output                0 ml  Net              510 ml   Filed Weights   07/26/16 2214  Weight: 86.4 kg (190 lb 8 oz)    Examination:  General exam: Appears calm and comfortable Respiratory system: Clear to auscultation. Respiratory effort normal. Cardiovascular system: S1 & S2 heard, RRR. No JVD, murmurs, rubs, gallops or clicks. Gastrointestinal system: Abdomen is nondistended, soft and nontender. No organomegaly or masses felt. Normal bowel sounds heard. Central nervous system: Alert and oriented. No focal neurological deficits. Extremities:  no cyanosis, normal muscle bulk, no edema Skin: No rashes, lesions or ulcers Psychiatry: Judgement and insight appear normal. Mood & affect appropriate.     Data Reviewed: I have personally reviewed following labs and imaging studies  CBC:  Recent Labs Lab 07/24/16 1638 07/26/16 1127  WBC 9.6 8.5  NEUTROABS 6.4  --   HGB 14.6 12.7  HCT 46.3* 41.6  MCV 98.7 101.7*  PLT 374 280   Basic Metabolic Panel:  Recent Labs Lab 07/24/16 1638 07/26/16 1127  NA 143 138  K 3.5 3.8  CL 100* 101  CO2 35* 30  GLUCOSE 114* 117*  BUN 18 12  CREATININE 1.00 0.71  CALCIUM 10.1 8.8*   GFR: Estimated Creatinine Clearance: 51.6 mL/min (by C-G formula based on SCr of 0.8 mg/dL). Liver Function Tests:  Recent Labs Lab 07/24/16 1638  AST 21  ALT 20  ALKPHOS 93  BILITOT 0.5  PROT 7.7  ALBUMIN 3.8    Recent Labs Lab 07/24/16 1638  LIPASE 18   No results for input(s): AMMONIA in the last 168 hours. Coagulation Profile: No results for input(s): INR, PROTIME in the last 168 hours. Cardiac Enzymes: No results for input(s): CKTOTAL, CKMB, CKMBINDEX, TROPONINI in the last 168 hours. BNP (last 3 results) No results for input(s): PROBNP in the last 8760 hours. HbA1C: No results for input(s): HGBA1C in the last 72 hours. CBG: No results for input(s): GLUCAP in the last 168 hours. Lipid Profile: No results for input(s): CHOL, HDL,  LDLCALC, TRIG, CHOLHDL, LDLDIRECT in the last 72 hours. Thyroid Function Tests: No results for input(s): TSH, T4TOTAL, FREET4, T3FREE, THYROIDAB in the last 72 hours. Anemia Panel: No results for input(s): VITAMINB12, FOLATE, FERRITIN, TIBC, IRON, RETICCTPCT in the last 72 hours. Sepsis Labs: No results for input(s): PROCALCITON, LATICACIDVEN in the last 168 hours.  Recent Results (from the past 240 hour(s))  Culture, blood (routine x 2) Call MD if unable to obtain prior to antibiotics being given     Status: None (Preliminary result)   Collection Time: 07/25/16 12:48 AM  Result Value Ref Range Status   Specimen Description BLOOD LEFT ANTECUBITAL  Final   Special Requests BOTTLES DRAWN AEROBIC AND ANAEROBIC  Final   Culture   Final    NO GROWTH 2 DAYS Performed at [facility]    Report Status PENDING  Incomplete  Culture, blood (routine x 2) Call MD if unable to obtain prior to antibiotics being given     Status: None (Preliminary result)   Collection Time: 07/25/16 12:59 AM  Result Value Ref Range Status   Specimen Description BLOOD RIGHT ANTECUBITAL  Final   Special Requests BOTTLES DRAWN AEROBIC AND ANAEROBIC  Final   Culture   Final    NO GROWTH 2 DAYS Performed at [facility]    Report Status PENDING  Incomplete         Radiology Studies: Dg Chest Port 1 View  Result Date: 07/26/2016 CLINICAL DATA:  Dyspnea today. EXAM: PORTABLE CHEST 1 VIEW COMPARISON:  Frontal and lateral views 07/24/2016, included portions from lung bases 07/24/2016 FINDINGS: Low lung volumes. The 4 cm left lower lobe rounded opacity is not seen radiographically. Unchanged right basilar atelectasis, increased left basilar opacity. Heart size and mediastinal contours are unchanged with thoracic aortic atherosclerosis. No developing pleural effusion or evidence of pneumothorax. Kyphoplasty at the thoracolumbar junction. IMPRESSION: Low lung volumes. Increasing left basilar opacity  which may be atelectasis or aspiration. The rounded subpleural opacity in the left lower lobe on CT is not well seen radiographically. Electronically Signed   By: Rubye Oaks M.D.   On: 07/26/2016 03:48   Dg Swallowing Func-speech Pathology  Result Date: 07/25/2016 Objective Swallowing Evaluation: Type of Study: MBS-Modified Barium Swallow Study Patient Details Name: Nawaal Alling MRN: [id number] Date of Birth: 12-Jul-1921 Today's Date: 07/25/2016 Time: SLP Start Time (ACUTE ONLY): 1345-SLP Stop Time (ACUTE ONLY): 1430 SLP Time Calculation (min) (ACUTE ONLY): 45 min Past Medical History: Past Medical History: Diagnosis Date . Abnormality of gait and mobility  . Depression  . Gastroesophageal reflux disease without esophagitis  . Hereditary and idiopathic neuropathy  . Hypothyroidism  . Irregular sleep-wake rhythm, nonorganic origin  . Osteoarthritis  . Osteoporosis  . Vertigo  . Vitamin B 12 deficiency  Past Surgical History: Past Surgical History: Procedure Laterality  Date . ALLOGRAFT APPLICATION  2008 . CHOLECYSTECTOMY   . COSMETIC SURGERY   . ORIF NONUNION HUMERUS FRACTURE Left   OP1 . t12 vertebroplasty   HPI: 82 yo female [facility] due to concerns for pneumonia.  Daughter reports this is her mother's first pna.  Pt with PMH + for dementia, GERD without esophagitis, fall with broken arm, decreased mobility status.  Per daughter, pt has had cough since July of this year [facility].  Pt diet changed to nectar on 8/21 but daughter reports no improvement with cough during intake.  Daughter reports mother normally feeds herself.  Good appetitie until the last few weeks reported.  Due to pt's dementia, her historical ability is limited at best.  Evaluation ordered in hospital.   Subjective: Pt seen in radiology for MBS. Daughter present Assessment / Plan / Recommendation CHL IP CLINICAL IMPRESSIONS 07/25/2016 Therapy Diagnosis Mild oral phase dysphagia;Mild pharyngeal  phase dysphagia Clinical Impression Mild oropharyngeal dysphagia, characterized orally by piecemeal swallow of larger bolus of puree, which required cue to dry swallow. Pharyngeally, pt exhibited delayed swallow reflex due to decreased sensation for primary trigger site. Trigger noted at the vallecula on puree and solid, and at the pyriform sinus on thin liquid via cup and straw. Post-swallow vallecular residue was noted only on the larger bolus of puree consistency, and cued dry swallow effectively cleared residue. No penetration or aspiration of any consistency noted.  Esophageal sweep revealed slow clearing, increasing aspiration risk after the swallow. Pt's daughter was present during this assessment, and was provided with written dietary and behavioral strategies to manage esophageal dysmotility. Recommend soft solids with chopped meats, thin liquids via cup or straw. Based on performance today, thickener is not needed at this time. Recommend adherence to safe swallow precautions and strategies for managment of esophageal dysmotility. Pt may benefit from cueing during the meal, given decreased recall.  Impact on safety and function Mild aspiration risk   CHL IP TREATMENT RECOMMENDATION 07/25/2016 Treatment Recommendations No treatment recommended at this time   Prognosis 07/25/2016 Prognosis for Safe Diet Advancement Fair Barriers to Reach Goals Cognitive deficits CHL IP DIET RECOMMENDATION 07/25/2016 SLP Diet Recommendations Dysphagia 3 (Mech soft) solids;Thin liquid Liquid Administration via Cup;Straw Medication Administration Whole meds with liquid Compensations Minimize environmental distractions;Slow rate;Small sips/bites;Follow solids with liquid;Multiple dry swallows after each bite/sip Postural Changes Remain semi-upright after after feeds/meals (Comment);Seated upright at 90 degrees   CHL IP OTHER RECOMMENDATIONS 07/25/2016 Recommended Consults Consider esophageal assessment Oral Care Recommendations Oral  care BID   CHL IP FOLLOW UP RECOMMENDATIONS 07/25/2016 Follow up Recommendations 24 hour supervision/assistance    CHL IP ORAL PHASE 07/25/2016 Oral Phase Impaired Oral - Puree Piecemeal swallowing  CHL IP PHARYNGEAL PHASE 07/25/2016 Pharyngeal Phase Impaired Pharyngeal- Thin Cup Delayed swallow initiation-pyriform sinuses Pharyngeal- Thin Straw Delayed swallow initiation-pyriform sinuses Pharyngeal- Puree Delayed swallow initiation-vallecula;Pharyngeal residue - valleculae Pharyngeal- Mechanical Soft Delayed swallow initiation-vallecula  CHL IP CERVICAL ESOPHAGEAL PHASE 07/25/2016 Cervical Esophageal Phase Impaired Leigh AuroraBueche, Celia Brown 07/25/2016, 3:05 PM Celia B. Bueche, MSP, CCC-SLP [phone]                   Scheduled Meds: . acidophilus  1 capsule Oral q morning - 10a  . aspirin  81 mg Oral Daily  . azithromycin  500 mg Oral Q24H  . cefTRIAXone (ROCEPHIN)  IV  1 g Intravenous Q24H  . donepezil  10 mg Oral QHS  . enoxaparin (  LOVENOX) injection  40 mg Subcutaneous Q24H  . fluticasone  1 spray Each Nare Daily  . levothyroxine  88 mcg Oral QAC breakfast  . loratadine  10 mg Oral Daily  . memantine  10 mg Oral BID  . polyethylene glycol  17 g Oral Daily  . pramipexole  0.25 mg Oral TID  . senna-docusate  1 tablet Oral BID  . sertraline  50 mg Oral Daily   Continuous Infusions: . sodium chloride 10 mL/hr at 07/26/16 0155     LOS: 3 days     Jacquelin Hawking, MD [facility] 07/27/2016, 2:10 PM Pager: [phone]  If 7PM-7AM, please contact night-coverage [email or web address] Password [email or web address] 07/27/2016, 2:10 PM

## 2016-07-28 DIAGNOSIS — J189 Pneumonia, unspecified organism: Secondary | ICD-10-CM

## 2016-07-28 DIAGNOSIS — J9621 Acute and chronic respiratory failure with hypoxia: Secondary | ICD-10-CM

## 2016-07-28 DIAGNOSIS — E038 Other specified hypothyroidism: Secondary | ICD-10-CM

## 2016-07-28 MED ORDER — AZITHROMYCIN 500 MG PO TABS: 500.0000 mg | ORAL_TABLET | Freq: Every day | ORAL | 0 refills | Status: DC

## 2016-07-28 MED ORDER — AMOXICILLIN 500 MG PO CAPS: 500.0000 mg | ORAL_CAPSULE | Freq: Three times a day (TID) | ORAL | 0 refills | Status: DC

## 2016-07-28 NOTE — Clinical Social Work Note (Signed)
Medical Social Worker facilitated patient discharge including contacting patient family and facility to confirm patient discharge plans.  Clinical information faxed to facility and family agreeable with plan.  MSW arranged ambulance transport via [facility] to [facility].  RN to call report prior to discharge [phone].  Medical Social Worker will sign off for now as social work intervention is no longer needed. Please consult us again if new need arises.  Derenda FennelBashira Kamilya Wakeman, MSW [phone] 07/28/2016 11:13 AM

## 2016-07-28 NOTE — Discharge Summary (Signed)
Physician Discharge Summary  Ellen Hughes UJW:[id number] DOB: 06/22/1921 DOA: 07/24/2016  PCP: Bufford Spikes, DO  Admit date: 07/24/2016 Discharge date: 07/28/2016  Recommendations for Outpatient Follow-up:  1. Continue amoxicillin and azithromycin for 5 days on discharge for pneumonia   Discharge Diagnoses:  Principal Problem:   CAP (community acquired pneumonia) Active Problems:   Dementia without behavioral disturbance   Pica    Discharge Condition: stable   Diet recommendation: as tolerated   History of present illness:  82 year old female with past medical history of dementia, depression, hypothyroidism who presented to [facility] hospital with vomiting. Patient was found to have pneumonia and was started on broad-spectrum antibiotics azithromycin and Rocephin.  Hospital Course:  82 year old female with a past medical history significant of dementia, depression, hypothyroidism who presented secondary to episodes of vomiting and found to have a possible pneumonia.   Assessment & Plan:   Community acquired pneumonia, possibly secondary to aspiration / Acute hypoxic respiratory failure - Continue amoxicillin and azithromycin PO for 5 days on discharge  - Stable resp status - Oxygen saturation 98% on room air  Hypothyroidism - Continue Synthroid  Dementia - Continue Namenda - Continue Aricept   DVT prophylaxis: Lovenox subcutaneous Code Status: DNR/DNI Family Communication: No family at the bedside this am    Consultants:   None  Procedures:  Echocardiogram (8/26) - Normal EF with grade 1 DD  Antimicrobials:  Ceftriaxone (8/25 >>8/26)  Azithromycin 8/25>> for 5 days on discharge   Amoxicillin 8/26>> for 5 days on discharge    Signed:  Manson Passey, MD  [facility] 07/28/2016, 11:01 AM  Pager #: [phone]  Time spent in minutes: less than 30 minutes   Discharge Exam: Vitals:   07/27/16 2135 07/28/16 0635  BP: (!) 111/58  137/62  Pulse: 95 91  Resp: 18 18  Temp: 98.7 F (37.1 C) 98.7 F (37.1 C)   Vitals:   07/27/16 1539 07/27/16 2135 07/28/16 0500 07/28/16 0635  BP: (!) 152/64 (!) 111/58  137/62  Pulse: 93 95  91  Resp: 18 18  18   Temp: 97.7 F (36.5 C) 98.7 F (37.1 C)  98.7 F (37.1 C)  TempSrc: Oral Oral  Axillary  SpO2: 94% 95%  98%  Weight:   88.2 kg (194 lb 6.4 oz)   Height:        General: Pt is alert, follows commands appropriately, not in acute distress Cardiovascular: Regular rate and rhythm, S1/S2 + Respiratory: Clear to auscultation bilaterally, no wheezing, no crackles, no rhonchi Abdominal: Soft, non tender, non distended, bowel sounds +, no guarding Extremities: no cyanosis, pulses palpable bilaterally DP and PT Neuro: Grossly nonfocal  Discharge Instructions  Discharge Instructions    Call MD for:  difficulty breathing, headache or visual disturbances    Complete by:  As directed   Call MD for:  persistant dizziness or light-headedness    Complete by:  As directed   Call MD for:  redness, tenderness, or signs of infection (pain, swelling, redness, odor or green/yellow discharge around incision site)    Complete by:  As directed   Call MD for:  severe uncontrolled pain    Complete by:  As directed   Diet - low sodium heart healthy    Complete by:  As directed   Increase activity slowly    Complete by:  As directed       Medication List    STOP taking these medications   cefUROXime 250 MG tablet Commonly  known as:  CEFTIN     TAKE these medications   acetaminophen 325 MG tablet Commonly known as:  TYLENOL Take 650 mg by mouth every 6 (six) hours as needed (pain.).   ALIGN 4 MG Caps Take 4 mg by mouth. Take one each morning   amoxicillin 500 MG capsule Commonly known as:  AMOXIL Take 1 capsule (500 mg total) by mouth every 8 (eight) hours.   aspirin 81 MG chewable tablet Chew by mouth daily.   azithromycin 500 MG tablet Commonly known as:  ZITHROMAX Take 1  tablet (500 mg total) by mouth daily.   Calcium Carbonate-Vitamin D 600-400 MG-UNIT tablet Take 1 tablet by mouth daily.   cetirizine 5 MG tablet Commonly known as:  ZYRTEC Take 5 mg by mouth every evening.   donepezil 10 MG tablet Commonly known as:  ARICEPT Take 10 mg by mouth at bedtime. For memory   fluticasone 50 MCG/ACT nasal spray Commonly known as:  FLONASE Place 1 spray into both nostrils. Twice daily   food thickener Powd Commonly known as:  THICK IT Take by mouth as needed (one week trial of nectar-thickened liquids until 07/28/16. No straws.).   hydrochlorothiazide 12.5 MG capsule Commonly known as:  MICROZIDE Take 12.5 mg by mouth daily.   levothyroxine 88 MCG tablet Commonly known as:  SYNTHROID, LEVOTHROID Take 88 mcg by mouth daily before breakfast.   memantine 10 MG tablet Commonly known as:  NAMENDA Take 10 mg by mouth 2 (two) times daily.   polyethylene glycol packet Commonly known as:  MIRALAX / GLYCOLAX Take 17 g by mouth daily.   pramipexole 0.25 MG tablet Commonly known as:  MIRAPEX Take 0.25 mg by mouth. Take one tablet at bedtime   sertraline 50 MG tablet Commonly known as:  ZOLOFT Take 50 mg by mouth daily.   vitamin B-12 1000 MCG tablet Commonly known as:  CYANOCOBALAMIN Take 1,000 mcg by mouth daily.   Vitamin D3 2000 units Tabs Take by mouth. Take one each morning      Follow-up Information    REED, TIFFANY, DO. Schedule an appointment as soon as possible for a visit in 2 week(s).   Specialty:  Geriatric Medicine Contact information: [address] [place] [address] [phone]            The results of significant diagnostics from this hospitalization (including imaging, microbiology, ancillary and laboratory) are listed below for reference.    Significant Diagnostic Studies: Ct Abdomen Pelvis Wo Contrast  Result Date: 07/24/2016 CLINICAL DATA:  Vomiting. EXAM: CT ABDOMEN AND PELVIS WITHOUT CONTRAST TECHNIQUE:  Multidetector CT imaging of the abdomen and pelvis was performed following the standard protocol without IV contrast. COMPARISON:  None. FINDINGS: Lower chest and abdominal wall: Dense subpleural ovoid opacity in the left lower lobe which measures at maximal 4 cm. Hepatobiliary: No focal liver abnormality. Calcification near the bare area. Cholecystectomy clips. Pancreas: Unremarkable. Spleen: Unremarkable. Adrenals/Urinary Tract: Negative adrenals. Malrotated right kidney with extrarenal pelvis. Patulous right ureter without ureteral stone. There is right nephrolithiasis with calculi measuring up to 6 mm. Indistinct thick walled bladder circumferentially. Stomach/Bowel: Prominent rectal stool burden with trace mesorectal fat edema. No obstruction. Diverticulosis without diverticulitis. No appendicitis. Reproductive:The vagina is anteriorly displaced and contains fluid. The endometrial cavity is gas-filled and distended. The rectum is in close continuity to the vagina and posterior uterine body. Vascular/Lymphatic: Diffuse atherosclerotic calcification No mass or adenopathy. Other: No ascites or pneumoperitoneum. Musculoskeletal: Advanced and diffuse disc and  facet degeneration with lumbar levoscoliosis. Remote T12 compression fracture with moderate retropulsion. Status post vertebroplasty. IMPRESSION: 1. Cystitis. 2. Gas distended endometrial cavity and fluid in the upper vagina. This implies a rectovaginal/uterine fistula. 3. 4 cm opacity in the subpleural left lower lobe could reflect pneumonia or mass. If pneumonia symptoms followup PA and lateral chest X-ray is recommended in 3-4 weeks following trial of antibiotic therapy to ensure resolution and exclude underlying malignancy. 4. Rectal distention with stool, possible developing impaction. 5. Right nephrolithiasis. Electronically Signed   By: Marnee Spring M.D.   On: 07/24/2016 22:23   Dg Chest 2 View  Result Date: 07/24/2016 CLINICAL DATA:  Cough,  ingested foreign body EXAM: CHEST  2 VIEW COMPARISON:  CT chest dated 10/06/2007 FINDINGS: Increased interstitial markings. Mild bibasilar opacities, likely atelectasis. No focal consolidation. No pleural effusion or pneumothorax. Cardiomegaly. Mild degenerative changes of the visualized thoracolumbar spine. IMPRESSION: No evidence of acute cardiopulmonary disease. Electronically Signed   By: Charline Bills M.D.   On: 07/24/2016 17:16   Dg Chest Port 1 View  Result Date: 07/26/2016 CLINICAL DATA:  Dyspnea today. EXAM: PORTABLE CHEST 1 VIEW COMPARISON:  Frontal and lateral views 07/24/2016, included portions from lung bases 07/24/2016 FINDINGS: Low lung volumes. The 4 cm left lower lobe rounded opacity is not seen radiographically. Unchanged right basilar atelectasis, increased left basilar opacity. Heart size and mediastinal contours are unchanged with thoracic aortic atherosclerosis. No developing pleural effusion or evidence of pneumothorax. Kyphoplasty at the thoracolumbar junction. IMPRESSION: Low lung volumes. Increasing left basilar opacity which may be atelectasis or aspiration. The rounded subpleural opacity in the left lower lobe on CT is not well seen radiographically. Electronically Signed   By: Rubye Oaks M.D.   On: 07/26/2016 03:48   Dg Swallowing Func-speech Pathology  Result Date: 07/25/2016 Objective Swallowing Evaluation: Type of Study: MBS-Modified Barium Swallow Study Patient Details Name: Ellen Hughes MRN: [id number] Date of Birth: 02-22-1921 Today's Date: 07/25/2016 Time: SLP Start Time (ACUTE ONLY): 1345-SLP Stop Time (ACUTE ONLY): 1430 SLP Time Calculation (min) (ACUTE ONLY): 45 min Past Medical History: Past Medical History: Diagnosis Date . Abnormality of gait and mobility  . Depression  . Gastroesophageal reflux disease without esophagitis  . Hereditary and idiopathic neuropathy  . Hypothyroidism  . Irregular sleep-wake rhythm, nonorganic origin  . Osteoarthritis  . Osteoporosis   . Vertigo  . Vitamin B 12 deficiency  Past Surgical History: Past Surgical History: Procedure Laterality Date . ALLOGRAFT APPLICATION  2008 . CHOLECYSTECTOMY   . COSMETIC SURGERY   . ORIF NONUNION HUMERUS FRACTURE Left   OP1 . t12 vertebroplasty   HPI: 82 yo female adm to [facility] from SNF due to concerns for pneumonia.  Daughter reports this is her mother's first pna.  Pt with PMH + for dementia, GERD without esophagitis, fall with broken arm, decreased mobility status.  Per daughter, pt has had cough since July of this year and SLP at [facility] concerned for possible aspiration.  Pt diet changed to nectar on 8/21 but daughter reports no improvement with cough during intake.  Daughter reports mother normally feeds herself.  Good appetitie until the last few weeks reported.  Due to pt's dementia, her historical ability is limited at best.  Evaluation ordered in hospital.   Subjective: Pt seen in radiology for MBS. Daughter present Assessment / Plan / Recommendation CHL IP CLINICAL IMPRESSIONS 07/25/2016 Therapy Diagnosis Mild oral phase dysphagia;Mild pharyngeal phase dysphagia Clinical Impression Mild oropharyngeal dysphagia, characterized orally by piecemeal swallow  of larger bolus of puree, which required cue to dry swallow. Pharyngeally, pt exhibited delayed swallow reflex due to decreased sensation for primary trigger site. Trigger noted at the vallecula on puree and solid, and at the pyriform sinus on thin liquid via cup and straw. Post-swallow vallecular residue was noted only on the larger bolus of puree consistency, and cued dry swallow effectively cleared residue. No penetration or aspiration of any consistency noted.  Esophageal sweep revealed slow clearing, increasing aspiration risk after the swallow. Pt's daughter was present during this assessment, and was provided with written dietary and behavioral strategies to manage esophageal dysmotility. Recommend soft solids with chopped meats, thin liquids via cup or  straw. Based on performance today, thickener is not needed at this time. Recommend adherence to safe swallow precautions and strategies for managment of esophageal dysmotility. Pt may benefit from cueing during the meal, given decreased recall.  Impact on safety and function Mild aspiration risk   CHL IP TREATMENT RECOMMENDATION 07/25/2016 Treatment Recommendations No treatment recommended at this time   Prognosis 07/25/2016 Prognosis for Safe Diet Advancement Fair Barriers to Reach Goals Cognitive deficits CHL IP DIET RECOMMENDATION 07/25/2016 SLP Diet Recommendations Dysphagia 3 (Mech soft) solids;Thin liquid Liquid Administration via Cup;Straw Medication Administration Whole meds with liquid Compensations Minimize environmental distractions;Slow rate;Small sips/bites;Follow solids with liquid;Multiple dry swallows after each bite/sip Postural Changes Remain semi-upright after after feeds/meals (Comment);Seated upright at 90 degrees   CHL IP OTHER RECOMMENDATIONS 07/25/2016 Recommended Consults Consider esophageal assessment Oral Care Recommendations Oral care BID   CHL IP FOLLOW UP RECOMMENDATIONS 07/25/2016 Follow up Recommendations 24 hour supervision/assistance    CHL IP ORAL PHASE 07/25/2016 Oral Phase Impaired Oral - Puree Piecemeal swallowing  CHL IP PHARYNGEAL PHASE 07/25/2016 Pharyngeal Phase Impaired Pharyngeal- Thin Cup Delayed swallow initiation-pyriform sinuses Pharyngeal- Thin Straw Delayed swallow initiation-pyriform sinuses Pharyngeal- Puree Delayed swallow initiation-vallecula;Pharyngeal residue - valleculae Pharyngeal- Mechanical Soft Delayed swallow initiation-vallecula  CHL IP CERVICAL ESOPHAGEAL PHASE 07/25/2016 Cervical Esophageal Phase Impaired Leigh Aurora 07/25/2016, 3:05 PM Celia B. Bueche, [facility], CCC-SLP [phone]               Microbiology: Recent Results (from the past 240 hour(s))  Culture, blood (routine x 2) Call MD if unable to obtain prior to antibiotics being given     Status:  None (Preliminary result)   Collection Time: 07/25/16 12:48 AM  Result Value Ref Range Status   Specimen Description BLOOD LEFT ANTECUBITAL  Final   Special Requests BOTTLES DRAWN AEROBIC AND ANAEROBIC  Final   Culture   Final    NO GROWTH 2 DAYS Performed at [facility]    Report Status PENDING  Incomplete  Culture, blood (routine x 2) Call MD if unable to obtain prior to antibiotics being given     Status: None (Preliminary result)   Collection Time: 07/25/16 12:59 AM  Result Value Ref Range Status   Specimen Description BLOOD RIGHT ANTECUBITAL  Final   Special Requests BOTTLES DRAWN AEROBIC AND ANAEROBIC  Final   Culture   Final    NO GROWTH 2 DAYS Performed at [facility]    Report Status PENDING  Incomplete     Labs: Basic Metabolic Panel:  Recent Labs Lab 07/24/16 1638 07/26/16 1127  NA 143 138  K 3.5 3.8  CL 100* 101  CO2 35* 30  GLUCOSE 114* 117*  BUN 18 12  CREATININE 1.00 0.71  CALCIUM 10.1 8.8*   Liver Function Tests:  Recent  Labs Lab 07/24/16 1638  AST 21  ALT 20  ALKPHOS 93  BILITOT 0.5  PROT 7.7  ALBUMIN 3.8    Recent Labs Lab 07/24/16 1638  LIPASE 18   No results for input(s): AMMONIA in the last 168 hours. CBC:  Recent Labs Lab 07/24/16 1638 07/26/16 1127  WBC 9.6 8.5  NEUTROABS 6.4  --   HGB 14.6 12.7  HCT 46.3* 41.6  MCV 98.7 101.7*  PLT 374 280   Cardiac Enzymes: No results for input(s): CKTOTAL, CKMB, CKMBINDEX, TROPONINI in the last 168 hours. BNP: BNP (last 3 results) No results for input(s): BNP in the last 8760 hours.  ProBNP (last 3 results) No results for input(s): PROBNP in the last 8760 hours.  CBG: No results for input(s): GLUCAP in the last 168 hours.

## 2016-07-28 NOTE — Discharge Instructions (Signed)
Amoxicillin capsules or tablets  What is this medicine?  AMOXICILLIN (a mox i SIL in) is a penicillin antibiotic. It is used to treat certain kinds of bacterial infections. It will not work for colds, flu, or other viral infections.  This medicine may be used for other purposes; ask your health care provider or pharmacist if you have questions.  What should I tell my health care provider before I take this medicine?  They need to know if you have any of these conditions:  -asthma  -kidney disease  -an unusual or allergic reaction to amoxicillin, other penicillins, cephalosporin antibiotics, other medicines, foods, dyes, or preservatives  -pregnant or trying to get pregnant  -breast-feeding  How should I use this medicine?  Take this medicine by mouth with a glass of water. Follow the directions on your prescription label. You may take this medicine with food or on an empty stomach. Take your medicine at regular intervals. Do not take your medicine more often than directed. Take all of your medicine as directed even if you think your are better. Do not skip doses or stop your medicine early.  Talk to your pediatrician regarding the use of this medicine in children. While this drug may be prescribed for selected conditions, precautions do apply.  Overdosage: If you think you have taken too much of this medicine contact a poison control center or emergency room at once.  NOTE: This medicine is only for you. Do not share this medicine with others.  What if I miss a dose?  If you miss a dose, take it as soon as you can. If it is almost time for your next dose, take only that dose. Do not take double or extra doses.  What may interact with this medicine?  -amiloride  -birth control pills  -chloramphenicol  -macrolides  -probenecid  -sulfonamides  -tetracyclines  This list may not describe all possible interactions. Give your health care provider a list of all the medicines, herbs, non-prescription drugs, or dietary  supplements you use. Also tell them if you smoke, drink alcohol, or use illegal drugs. Some items may interact with your medicine.  What should I watch for while using this medicine?  Tell your doctor or health care professional if your symptoms do not improve in 2 or 3 days. Take all of the doses of your medicine as directed. Do not skip doses or stop your medicine early.  If you are diabetic, you may get a false positive result for sugar in your urine with certain brands of urine tests. Check with your doctor.  Do not treat diarrhea with over-the-counter products. Contact your doctor if you have diarrhea that lasts more than 2 days or if the diarrhea is severe and watery.  What side effects may I notice from receiving this medicine?  Side effects that you should report to your doctor or health care professional as soon as possible:  -allergic reactions like skin rash, itching or hives, swelling of the face, lips, or tongue  -breathing problems  -dark urine  -redness, blistering, peeling or loosening of the skin, including inside the mouth  -seizures  -severe or watery diarrhea  -trouble passing urine or change in the amount of urine  -unusual bleeding or bruising  -unusually weak or tired  -yellowing of the eyes or skin  Side effects that usually do not require medical attention (report to your doctor or health care professional if they continue or are bothersome):  -dizziness  -headache  -  stomach upset  -trouble sleeping  This list may not describe all possible side effects. Call your doctor for medical advice about side effects. You may report side effects to FDA at 1-800-FDA-1088.  Where should I keep my medicine?  Keep out of the reach of children.  Store between 68 and 77 degrees F (20 and 25 degrees C). Keep bottle closed tightly. Throw away any unused medicine after the expiration date.  NOTE: This sheet is a summary. It may not cover all possible information. If you have questions about this medicine, talk  to your doctor, pharmacist, or health care provider.     © 2016, [employer]. (2008-02-08 14:10:59)

## 2016-07-28 NOTE — Progress Notes (Signed)
Assessment unchanged. Packet as prepared by SW given to [employer] [facility]. Pt discharged via stretcher accompanied by daughter and [facility] paramedics x 2 to meet awaiting ambulance [facility] called and report given to nurse Carollee HerterShannon regarding pt's transfer back to facility. Also reported recent VS's, meds given so far today, respiratory status and bowel/bladder status.

## 2016-07-29 ENCOUNTER — Encounter: Payer: Self-pay | Admitting: Internal Medicine

## 2016-07-29 ENCOUNTER — Non-Acute Institutional Stay (SKILLED_NURSING_FACILITY): Payer: Medicare Other | Admitting: Internal Medicine

## 2016-07-29 DIAGNOSIS — K5901 Slow transit constipation: Secondary | ICD-10-CM | POA: Diagnosis not present

## 2016-07-29 DIAGNOSIS — J690 Pneumonitis due to inhalation of food and vomit: Secondary | ICD-10-CM | POA: Diagnosis not present

## 2016-07-29 DIAGNOSIS — I10 Essential (primary) hypertension: Secondary | ICD-10-CM | POA: Diagnosis not present

## 2016-07-29 DIAGNOSIS — R05 Cough: Secondary | ICD-10-CM | POA: Diagnosis not present

## 2016-07-29 DIAGNOSIS — F5089 Other specified eating disorder: Secondary | ICD-10-CM | POA: Diagnosis not present

## 2016-07-29 DIAGNOSIS — F0390 Unspecified dementia without behavioral disturbance: Secondary | ICD-10-CM | POA: Diagnosis not present

## 2016-07-29 DIAGNOSIS — R053 Chronic cough: Secondary | ICD-10-CM

## 2016-07-29 NOTE — Progress Notes (Signed)
Patient ID: Deedee Lybarger, female   DOB: 07/27/1921, 82 y.o.   MRN: [id number]  Provider:  Gwenith Spitz. Renato Gails, D.O., C.M.D. Location:   [facility] Room Number: 134 Place of Service:  SNF (31)  PCP: Bufford Spikes, DO Patient Care Team: Kermit Balo, DO as PCP - General (Geriatric Medicine) Melvenia Needles, MD as Consulting Physician (Ophthalmology) Nadara Mustard, MD as Consulting Physician (Orthopedic Surgery) Donzetta Starch, MD as Consulting Physician (Dermatology) Thana Farr, MD as Consulting Physician (Neurology) Fletcher Anon, NP as Nurse Practitioner (Nurse Practitioner)  Extended Emergency Contact Information Primary Emergency Contact: [facility] Address: [address] [place], [place] [address] [place] of [place] Home Phone: [phone] Mobile Phone: [phone] Relation: Daughter  Code Status: DNR Goals of Care: Advanced Directive information Advanced Directives 07/29/2016  Does patient have an advance directive? -  Type of Advance Directive Out of facility DNR (pink MOST or yellow form);Healthcare Power of Attorney  Does patient want to make changes to advanced directive? -  Copy of advanced directive(s) in chart? Yes  Pre-existing out of facility DNR order (yellow form or pink MOST form) Yellow form placed in chart (order not valid for inpatient use)   Chief Complaint  Patient presents with  . Readmit To SNF    readmit    HPI: Patient is a 82 y.o. female seen today for readmission to snf s/p hospitalization from 8/24-8/28/17 after a concern that she had ingested soap and a kleenex that resulted in vomiting.  She had a GI evaluation and nothing was found.  A chest xray was done that showed a left base and left pleural opacity felt to be aspiration pneumonia.  She was treated with a zpak and rocephin.  She was sent back here with amoxicillin and azithromycin for 5 more days.  She also had some constipation while at the hospital.  She was  apparently diuresed, as well and her weight dropped from 200 to 190 lbs, but she was 195lbs upon return.    When she got back, she was more alert and sitting up in her wheelchair looking around.  She'd been coughing and congested prior to the hospitalization and more confused.  Her MMSE did decline to 13/30 failing clock upon return from the hospital.  Past Medical History:  Diagnosis Date  . Abnormality of gait and mobility   . Depression   . Gastroesophageal reflux disease without esophagitis   . Hereditary and idiopathic neuropathy   . Hypothyroidism   . Irregular sleep-wake rhythm, nonorganic origin   . Osteoarthritis   . Osteoporosis   . Vertigo   . Vitamin B 12 deficiency    Past Surgical History:  Procedure Laterality Date  . ALLOGRAFT APPLICATION  2008  . CHOLECYSTECTOMY    . COSMETIC SURGERY    . ORIF NONUNION HUMERUS FRACTURE Left    OP1  . t12 vertebroplasty      reports that she has never smoked. She has never used smokeless tobacco. She reports that she drinks alcohol. She reports that she does not use drugs. Social History   Social History  . Marital status: Widowed    Spouse name: N/A  . Number of children: N/A  . Years of education: N/A   Occupational History  . Housewife    Social History Main Topics  . Smoking status: Never Smoker  . Smokeless tobacco: Never Used  . Alcohol use Yes     Comment: social  only  . Drug use: No  . Sexual activity: Not on file   Other Topics Concern  . Not on file   Social History Narrative   Lives at [facility] since 10/15/2005   Widowed   Power wheelchair   Never smoked   Alcohol one glass of wine with dinner   Exercise none    DNR, POA, Living Will    Functional Status Survey:    Family History  Problem Relation Age of Onset  . Cancer Mother     pancreatic     Health Maintenance  Topic Date Due  . TETANUS/TDAP  08/07/1942  . DEXA SCAN  08/07/1988  . PNA vac Low Risk Adult (1 of 2 - PCV13)  08/07/1988  . INFLUENZA VACCINE  07/01/2016  . ZOSTAVAX  Completed    Allergies  Allergen Reactions  . Codeine   . Morphine And Related       Medication List       Accurate as of 07/29/16 11:43 AM. Always use your most recent med list.          acetaminophen 325 MG tablet Commonly known as:  TYLENOL Take 650 mg by mouth every 6 (six) hours as needed (pain.).   ALIGN 4 MG Caps Take 4 mg by mouth. Take one each morning   amoxicillin 500 MG capsule Commonly known as:  AMOXIL Take 1 capsule (500 mg total) by mouth every 8 (eight) hours.   aspirin 81 MG chewable tablet Chew by mouth daily.   azithromycin 500 MG tablet Commonly known as:  ZITHROMAX Take 1 tablet (500 mg total) by mouth daily.   Calcium Carbonate-Vitamin D 600-400 MG-UNIT tablet Take 1 tablet by mouth daily.   cetirizine 5 MG tablet Commonly known as:  ZYRTEC Take 5 mg by mouth every evening.   donepezil 10 MG tablet Commonly known as:  ARICEPT Take 10 mg by mouth at bedtime. For memory   fluticasone 50 MCG/ACT nasal spray Commonly known as:  FLONASE Place 1 spray into both nostrils. Twice daily   food thickener Powd Commonly known as:  THICK IT Take by mouth as needed (one week trial of nectar-thickened liquids until 07/28/16. No straws.).   hydrochlorothiazide 12.5 MG capsule Commonly known as:  MICROZIDE Take 12.5 mg by mouth daily.   levothyroxine 88 MCG tablet Commonly known as:  SYNTHROID, LEVOTHROID Take 88 mcg by mouth daily before breakfast.   memantine 10 MG tablet Commonly known as:  NAMENDA Take 10 mg by mouth 2 (two) times daily.   polyethylene glycol packet Commonly known as:  MIRALAX / GLYCOLAX Take 17 g by mouth daily.   pramipexole 0.25 MG tablet Commonly known as:  MIRAPEX Take 0.25 mg by mouth. Take one tablet at bedtime   sertraline 50 MG tablet Commonly known as:  ZOLOFT Take 50 mg by mouth daily.   vitamin B-12 1000 MCG tablet Commonly known as:   CYANOCOBALAMIN Take 1,000 mcg by mouth daily.   Vitamin D3 2000 units Tabs Take by mouth. Take one each morning       Review of Systems  Constitutional: Negative for activity change, appetite change, chills, fatigue and fever.  HENT: Negative for congestion and sinus pressure.   Eyes: Negative for visual disturbance.  Respiratory: Negative for chest tightness and shortness of breath.   Cardiovascular: Negative for chest pain and leg swelling.  Gastrointestinal: Negative for abdominal pain.  Genitourinary: Negative for dysuria.  Musculoskeletal: Positive for arthralgias.  Skin: Positive  for pallor. Negative for color change.  Neurological: Positive for weakness. Negative for dizziness.  Hematological: Negative for adenopathy. Does not bruise/bleed easily.  Psychiatric/Behavioral: Positive for confusion.       Baseline dementia    Vitals:   07/29/16 1130  BP: 140/80  Pulse: 76  Resp: 20  Temp: 98.6 F (37 C)  TempSrc: Oral  SpO2: 90%  Weight: 195 lb (88.5 kg)   Body mass index is 29.65 kg/m. Physical Exam  Constitutional: She appears well-developed and well-nourished. No distress.  Cardiovascular: Normal rate, regular rhythm and normal heart sounds.   Pulmonary/Chest: Effort normal. No respiratory distress. She has wheezes.  Some chronic rhonchi  Abdominal: Soft. Bowel sounds are normal.  Musculoskeletal: Normal range of motion.  Neurological: She is alert.  Oriented to person only  Skin: Skin is warm and dry.  Psychiatric: She has a normal mood and affect.   Labs reviewed: Basic Metabolic Panel:  Recent Labs  95/62/13 07/24/16 1638 07/26/16 1127  NA 144 143 138  K 4.6 3.5 3.8  CL  --  100* 101  CO2  --  35* 30  GLUCOSE  --  114* 117*  BUN 14 18 12   CREATININE 0.8 1.00 0.71  CALCIUM  --  10.1 8.8*   Liver Function Tests:  Recent Labs  07/24/16 1638  AST 21  ALT 20  ALKPHOS 93  BILITOT 0.5  PROT 7.7  ALBUMIN 3.8    Recent Labs   07/24/16 1638  LIPASE 18   No results for input(s): AMMONIA in the last 8760 hours. CBC:  Recent Labs  05/02/16 0300 07/24/16 1638 07/26/16 1127  WBC 7.8 9.6 8.5  NEUTROABS  --  6.4  --   HGB 13.4 14.6 12.7  HCT 45 46.3* 41.6  MCV  --  98.7 101.7*  PLT 322 374 280   Cardiac Enzymes: No results for input(s): CKTOTAL, CKMB, CKMBINDEX, TROPONINI in the last 8760 hours. BNP: Invalid input(s): POCBNP No results found for: HGBA1C Lab Results  Component Value Date   TSH 2.63 05/24/2015   Lab Results  Component Value Date   VITAMINB12 282 05/23/2011   No results found for: FOLATE No results found for: IRON, TIBC, FERRITIN  Imaging and Procedures obtained prior to SNF admission: Ct Abdomen Pelvis Wo Contrast  Result Date: 07/24/2016 CLINICAL DATA:  Vomiting. EXAM: CT ABDOMEN AND PELVIS WITHOUT CONTRAST TECHNIQUE: Multidetector CT imaging of the abdomen and pelvis was performed following the standard protocol without IV contrast. COMPARISON:  None. FINDINGS: Lower chest and abdominal wall: Dense subpleural ovoid opacity in the left lower lobe which measures at maximal 4 cm. Hepatobiliary: No focal liver abnormality. Calcification near the bare area. Cholecystectomy clips. Pancreas: Unremarkable. Spleen: Unremarkable. Adrenals/Urinary Tract: Negative adrenals. Malrotated right kidney with extrarenal pelvis. Patulous right ureter without ureteral stone. There is right nephrolithiasis with calculi measuring up to 6 mm. Indistinct thick walled bladder circumferentially. Stomach/Bowel: Prominent rectal stool burden with trace mesorectal fat edema. No obstruction. Diverticulosis without diverticulitis. No appendicitis. Reproductive:The vagina is anteriorly displaced and contains fluid. The endometrial cavity is gas-filled and distended. The rectum is in close continuity to the vagina and posterior uterine body. Vascular/Lymphatic: Diffuse atherosclerotic calcification No mass or adenopathy.  Other: No ascites or pneumoperitoneum. Musculoskeletal: Advanced and diffuse disc and facet degeneration with lumbar levoscoliosis. Remote T12 compression fracture with moderate retropulsion. Status post vertebroplasty. IMPRESSION: 1. Cystitis. 2. Gas distended endometrial cavity and fluid in the upper vagina. This implies a rectovaginal/uterine fistula.  3. 4 cm opacity in the subpleural left lower lobe could reflect pneumonia or mass. If pneumonia symptoms followup PA and lateral chest X-ray is recommended in 3-4 weeks following trial of antibiotic therapy to ensure resolution and exclude underlying malignancy. 4. Rectal distention with stool, possible developing impaction. 5. Right nephrolithiasis. Electronically Signed   By: Marnee SpringJonathon  Watts M.D.   On: 07/24/2016 22:23   Dg Chest 2 View  Result Date: 07/24/2016 CLINICAL DATA:  Cough, ingested foreign body EXAM: CHEST  2 VIEW COMPARISON:  CT chest dated 10/06/2007 FINDINGS: Increased interstitial markings. Mild bibasilar opacities, likely atelectasis. No focal consolidation. No pleural effusion or pneumothorax. Cardiomegaly. Mild degenerative changes of the visualized thoracolumbar spine. IMPRESSION: No evidence of acute cardiopulmonary disease. Electronically Signed   By: Charline BillsSriyesh  Krishnan M.D.   On: 07/24/2016 17:16   Dg Swallowing Func-speech Pathology  Result Date: 07/25/2016 Objective Swallowing Evaluation: Type of Study: MBS-Modified Barium Swallow Study Patient Details Name: Margit Bandaudrey Banghart MRN: [id number] Date of Birth: 04/11/1921 Today's Date: 07/25/2016 Time: SLP Start Time (ACUTE ONLY): 1345-SLP Stop Time (ACUTE ONLY): 1430 SLP Time Calculation (min) (ACUTE ONLY): 45 min Past Medical History: Past Medical History: Diagnosis Date . Abnormality of gait and mobility  . Depression  . Gastroesophageal reflux disease without esophagitis  . Hereditary and idiopathic neuropathy  . Hypothyroidism  . Irregular sleep-wake rhythm, nonorganic origin  . Osteoarthritis   . Osteoporosis  . Vertigo  . Vitamin B 12 deficiency  Past Surgical History: Past Surgical History: Procedure Laterality Date . ALLOGRAFT APPLICATION  2008 . CHOLECYSTECTOMY   . COSMETIC SURGERY   . ORIF NONUNION HUMERUS FRACTURE Left   OP1 . t12 vertebroplasty   HPI: 82 [facility] due to concerns for pneumonia.  Daughter reports this is her mother's first pna.  Pt with PMH + for dementia, GERD without esophagitis, fall with broken arm, decreased mobility status.  Per daughter, pt has had cough since July of this year [facility]  Pt diet changed to nectar on 8/21 but daughter reports no improvement with cough during intake.  Daughter reports mother normally feeds herself.  Good appetitie until the last few weeks reported.  Due to pt's dementia, her historical ability is limited at best.  Evaluation ordered in hospital.   Subjective: Pt seen in radiology for MBS. Daughter present Assessment / Plan / Recommendation CHL IP CLINICAL IMPRESSIONS 07/25/2016 Therapy Diagnosis Mild oral phase dysphagia;Mild pharyngeal phase dysphagia Clinical Impression Mild oropharyngeal dysphagia, characterized orally by piecemeal swallow of larger bolus of puree, which required cue to dry swallow. Pharyngeally, pt exhibited delayed swallow reflex due to decreased sensation for primary trigger site. Trigger noted at the vallecula on puree and solid, and at the pyriform sinus on thin liquid via cup and straw. Post-swallow vallecular residue was noted only on the larger bolus of puree consistency, and cued dry swallow effectively cleared residue. No penetration or aspiration of any consistency noted.  Esophageal sweep revealed slow clearing, increasing aspiration risk after the swallow. Pt's daughter was present during this assessment, and was provided with written dietary and behavioral strategies to manage esophageal dysmotility. Recommend soft solids with chopped meats, thin  liquids via cup or straw. Based on performance today, thickener is not needed at this time. Recommend adherence to safe swallow precautions and strategies for managment of esophageal dysmotility. Pt may benefit from cueing during the meal, given decreased recall.  Impact on safety and function Mild  aspiration risk   CHL IP TREATMENT RECOMMENDATION 07/25/2016 Treatment Recommendations No treatment recommended at this time   Prognosis 07/25/2016 Prognosis for Safe Diet Advancement Fair Barriers to Reach Goals Cognitive deficits CHL IP DIET RECOMMENDATION 07/25/2016 SLP Diet Recommendations Dysphagia 3 (Mech soft) solids;Thin liquid Liquid Administration via Cup;Straw Medication Administration Whole meds with liquid Compensations Minimize environmental distractions;Slow rate;Small sips/bites;Follow solids with liquid;Multiple dry swallows after each bite/sip Postural Changes Remain semi-upright after after feeds/meals (Comment);Seated upright at 90 degrees   CHL IP OTHER RECOMMENDATIONS 07/25/2016 Recommended Consults Consider esophageal assessment Oral Care Recommendations Oral care BID   CHL IP FOLLOW UP RECOMMENDATIONS 07/25/2016 Follow up Recommendations 24 hour supervision/assistance    CHL IP ORAL PHASE 07/25/2016 Oral Phase Impaired Oral - Puree Piecemeal swallowing  CHL IP PHARYNGEAL PHASE 07/25/2016 Pharyngeal Phase Impaired Pharyngeal- Thin Cup Delayed swallow initiation-pyriform sinuses Pharyngeal- Thin Straw Delayed swallow initiation-pyriform sinuses Pharyngeal- Puree Delayed swallow initiation-vallecula;Pharyngeal residue - valleculae Pharyngeal- Mechanical Soft Delayed swallow initiation-vallecula  CHL IP CERVICAL ESOPHAGEAL PHASE 07/25/2016 Cervical Esophageal Phase Impaired Leigh Aurora 07/25/2016, 3:05 PM Celia B. Bueche, MSP, CCC-SLP [id number]               Assessment/Plan 1. Aspiration pneumonia of left lower lobe, unspecified aspiration pneumonia type (HCC) -is finishing off her amoxicillin and  zithromax -is much improved, but still with some cough and congestion -IS encouraged  2. Chronic cough -probably having some silent aspiration -ST eval and tx may be helpful for her right now -needs head elevated to at least 30 degrees at all times to prevent aspiration  3. Pica -unclear--did definitely vomit up a kleenex per staff, but bite marks on soap odd b/c a caregiver is with her at all times when she's in the bathroom  4. Dementia without behavioral disturbance -cont namenda and aricept (presumably the namzaric wasn't covered for her)--wonder how much she benefits--will need to discuss with family  5. Essential hypertension -bp controlled with hctz  6. Slow transit constipation -cont miralax  ? Reason for her mirapex--could be worsening her confusion  Family/ staff Communication: discussed with evening SNF nurse  Labs/tests ordered: no new

## 2016-07-30 LAB — CULTURE, BLOOD (ROUTINE X 2)
CULTURE: NO GROWTH
Culture: NO GROWTH

## 2016-08-14 ENCOUNTER — Encounter: Payer: Self-pay | Admitting: Adult Health

## 2016-08-14 ENCOUNTER — Non-Acute Institutional Stay (SKILLED_NURSING_FACILITY): Payer: Medicare Other | Admitting: Adult Health

## 2016-08-14 DIAGNOSIS — H6123 Impacted cerumen, bilateral: Secondary | ICD-10-CM | POA: Diagnosis not present

## 2016-08-14 DIAGNOSIS — R1310 Dysphagia, unspecified: Secondary | ICD-10-CM | POA: Diagnosis not present

## 2016-08-14 DIAGNOSIS — J189 Pneumonia, unspecified organism: Secondary | ICD-10-CM | POA: Diagnosis not present

## 2016-08-14 DIAGNOSIS — F0390 Unspecified dementia without behavioral disturbance: Secondary | ICD-10-CM

## 2016-08-14 DIAGNOSIS — J309 Allergic rhinitis, unspecified: Secondary | ICD-10-CM

## 2016-08-14 LAB — BASIC METABOLIC PANEL
BUN: 21 mg/dL (ref 4–21)
Creatinine: 1 mg/dL (ref 0.5–1.1)
Glucose: 112 mg/dL
Potassium: 3.6 mmol/L (ref 3.4–5.3)
SODIUM: 143 mmol/L (ref 137–147)

## 2016-08-14 LAB — CBC AND DIFFERENTIAL
HEMATOCRIT: 45 % (ref 36–46)
HEMOGLOBIN: 14.8 g/dL (ref 12.0–16.0)
Platelets: 377 10*3/uL (ref 150–399)
WBC: 9.9 10^3/mL

## 2016-08-14 NOTE — Progress Notes (Signed)
Patient ID: Delrae Hagey, female   DOB: 10/24/1921, 82 y.o.   MRN: [id number]   Location:      Place of Service:  [facility] Provider:  Fletcher Anon NP  Bufford Spikes, DO  Patient Care Team: Kermit Balo, DO as PCP - General (Geriatric Medicine) Melvenia Needles, MD as Consulting Physician (Ophthalmology) Nadara Mustard, MD as Consulting Physician (Orthopedic Surgery) Donzetta Starch, MD as Consulting Physician (Dermatology) Thana Farr, MD as Consulting Physician (Neurology) Fletcher Anon, NP as Nurse Practitioner (Nurse Practitioner)  Extended Emergency Contact Information Primary Emergency Contact: [facility] Address: [address] [place], [place] [address] [place] Home Phone: [phone] Mobile Phone: [phone] Relation: Daughter  Code Status:  DNR Goals of care: Advanced Directive information Advanced Directives 08/14/2016  Does patient have an advance directive? Yes  Type of Advance Directive Out of facility DNR (pink MOST or yellow form);Healthcare Power of Attorney  Does patient want to make changes to advanced directive? -  Copy of advanced directive(s) in chart? Yes  Pre-existing out of facility DNR order (yellow form or pink MOST form) Yellow form placed in chart (order not valid for inpatient use)     Chief Complaint  Patient presents with  . Acute Visit    HPI:  Pt is a 82 y.o. female seen today for episode of hypoxia on 9/12.    She was admitted to the hospital 07/24/16-07/28/16 due to hypoxia and ? Of foreign body. This was rule out and she was diagnosed with LLL pna, treated with azithromycin and amoxicillin.  She has been afebrile but has required oxygen after discharge for a short time but this resolved. Staff reported one incident of hypoxia with sats in the 80s while lying flat on 9/12.  This resolved with being placed in the upright position. Staff denies any sob, fever, or cough. She continues with some  nasal congestion, currently on zyrtec and flonase for allergic rhinitis. Sat 92% today.  A f/u CXR on 08/07/16 showed central venous pulmonary congestion without overt pulmonary edema A mass vs pna was noted during her hospitalization by CT on 07/24/16, which was not noted on initial CXR but later commented on subsequent xray on 8/26.   3. 4 cm opacity in the subpleural left lower lobe could reflect pneumonia or mass.  Past Medical History:  Diagnosis Date  . Abnormality of gait and mobility   . Depression   . Gastroesophageal reflux disease without esophagitis   . Hereditary and idiopathic neuropathy   . Hypothyroidism   . Irregular sleep-wake rhythm, nonorganic origin   . Osteoarthritis   . Osteoporosis   . Vertigo   . Vitamin B 12 deficiency    Past Surgical History:  Procedure Laterality Date  . ALLOGRAFT APPLICATION  2008  . CHOLECYSTECTOMY    . COSMETIC SURGERY    . ORIF NONUNION HUMERUS FRACTURE Left    OP1  . t12 vertebroplasty      Allergies  Allergen Reactions  . Codeine   . Morphine And Related       Medication List       Accurate as of 08/14/16 10:43 AM. Always use your most recent med list.          acetaminophen 325 MG tablet Commonly known as:  TYLENOL Take 650 mg by mouth every 6 (six) hours as needed (pain.).   ALIGN 4 MG Caps Take 4 mg by mouth. Take  one each morning   amoxicillin 500 MG capsule Commonly known as:  AMOXIL Take 1 capsule (500 mg total) by mouth every 8 (eight) hours.   aspirin 81 MG chewable tablet Chew by mouth daily.   azithromycin 500 MG tablet Commonly known as:  ZITHROMAX Take 1 tablet (500 mg total) by mouth daily.   Calcium Carbonate-Vitamin D 600-400 MG-UNIT tablet Take 1 tablet by mouth daily.   cetirizine 5 MG tablet Commonly known as:  ZYRTEC Take 5 mg by mouth every evening.   donepezil 10 MG tablet Commonly known as:  ARICEPT Take 10 mg by mouth at bedtime. For memory   fluticasone 50 MCG/ACT nasal  spray Commonly known as:  FLONASE Place 1 spray into both nostrils. Twice daily   food thickener Powd Commonly known as:  THICK IT Take by mouth as needed (one week trial of nectar-thickened liquids until 07/28/16. No straws.).   hydrochlorothiazide 12.5 MG capsule Commonly known as:  MICROZIDE Take 12.5 mg by mouth daily.   levothyroxine 88 MCG tablet Commonly known as:  SYNTHROID, LEVOTHROID Take 88 mcg by mouth daily before breakfast.   memantine 10 MG tablet Commonly known as:  NAMENDA Take 10 mg by mouth 2 (two) times daily.   polyethylene glycol packet Commonly known as:  MIRALAX / GLYCOLAX Take 17 g by mouth daily.   pramipexole 0.25 MG tablet Commonly known as:  MIRAPEX Take 0.25 mg by mouth. Take one tablet at bedtime   sertraline 50 MG tablet Commonly known as:  ZOLOFT Take 50 mg by mouth daily.   vitamin B-12 1000 MCG tablet Commonly known as:  CYANOCOBALAMIN Take 1,000 mcg by mouth daily.   Vitamin D3 2000 units Tabs Take by mouth. Take one each morning       Review of Systems  Constitutional: Negative for activity change, appetite change, chills, diaphoresis, fatigue, fever and unexpected weight change.  HENT: Negative for congestion.   Respiratory: Negative for cough, shortness of breath and wheezing.   Cardiovascular: Negative for chest pain, palpitations and leg swelling.  Gastrointestinal: Negative for abdominal distention, abdominal pain, constipation and diarrhea.  Genitourinary: Negative for difficulty urinating and dysuria.  Musculoskeletal: Positive for arthralgias and gait problem. Negative for back pain, joint swelling and myalgias.  Neurological: Negative for dizziness, tremors, seizures, syncope, facial asymmetry, speech difficulty, weakness, light-headedness, numbness and headaches.  Psychiatric/Behavioral: Positive for confusion. Negative for agitation and behavioral problems.    Immunization History  Administered Date(s) Administered    . Influenza-Unspecified 09/13/2015  . PPD Test 05/25/2011, 10/14/2011  . Pneumococcal Conjugate-13 08/01/2014  . Pneumococcal Polysaccharide-23 11/27/2011  . Zoster 07/21/2012   Pertinent  Health Maintenance Due  Topic Date Due  . DEXA SCAN  08/07/1988  . INFLUENZA VACCINE  07/01/2016  . PNA vac Low Risk Adult  Completed   Fall Risk  03/07/2015  Falls in the past year? Yes  Number falls in past yr: 1   Functional Status Survey:   Wt Readings from Last 3 Encounters:  08/14/16 193 lb 12.8 oz (87.9 kg)  07/29/16 195 lb (88.5 kg)  07/28/16 194 lb 6.4 oz (88.2 kg)   Vitals:   08/14/16 1040  BP: 128/83  Pulse: 85  Resp: 20  Temp: 97.6 F (36.4 C)  SpO2: 92%  Weight: 193 lb 12.8 oz (87.9 kg)   Body mass index is 29.47 kg/m. Physical Exam  Constitutional: No distress.  HENT:  Head: Normocephalic and atraumatic.  Mouth/Throat: No oropharyngeal exudate.  Cerumen noted  to both ear canals, not able to view TMs.  Residual food left in mouth. Clear drainage to both nares  Neck: No JVD present.  Cardiovascular: Normal rate and regular rhythm.   No murmur heard. BLE +1 edema with purple discoloration   Pulmonary/Chest: Effort normal. No respiratory distress. She has wheezes (bilat exp, decreased bases also).  Abdominal: Soft. Bowel sounds are normal. She exhibits no distension.  Musculoskeletal: She exhibits no edema or tenderness.  Lymphadenopathy:    She has cervical adenopathy.  Neurological: She is alert. No cranial nerve deficit.  Oriented to self only  Skin: Skin is warm and dry. She is not diaphoretic.  Psychiatric: She has a normal mood and affect.  Nursing note and vitals reviewed.   Labs reviewed:  Recent Labs  03/03/16 07/24/16 1638 07/26/16 1127  NA 144 143 138  K 4.6 3.5 3.8  CL  --  100* 101  CO2  --  35* 30  GLUCOSE  --  114* 117*  BUN 14 18 12   CREATININE 0.8 1.00 0.71  CALCIUM  --  10.1 8.8*    Recent Labs  07/24/16 1638  AST 21  ALT 20   ALKPHOS 93  BILITOT 0.5  PROT 7.7  ALBUMIN 3.8    Recent Labs  05/02/16 0300 07/24/16 1638 07/26/16 1127  WBC 7.8 9.6 8.5  NEUTROABS  --  6.4  --   HGB 13.4 14.6 12.7  HCT 45 46.3* 41.6  MCV  --  98.7 101.7*  PLT 322 374 280   Lab Results  Component Value Date   TSH 2.63 05/24/2015   No results found for: HGBA1C No results found for: CHOL, HDL, LDLCALC, LDLDIRECT, TRIG, CHOLHDL  Significant Diagnostic Results in last 30 days:  Ct Abdomen Pelvis Wo Contrast  Result Date: 07/24/2016 CLINICAL DATA:  Vomiting. EXAM: CT ABDOMEN AND PELVIS WITHOUT CONTRAST TECHNIQUE: Multidetector CT imaging of the abdomen and pelvis was performed following the standard protocol without IV contrast. COMPARISON:  None. FINDINGS: Lower chest and abdominal wall: Dense subpleural ovoid opacity in the left lower lobe which measures at maximal 4 cm. Hepatobiliary: No focal liver abnormality. Calcification near the bare area. Cholecystectomy clips. Pancreas: Unremarkable. Spleen: Unremarkable. Adrenals/Urinary Tract: Negative adrenals. Malrotated right kidney with extrarenal pelvis. Patulous right ureter without ureteral stone. There is right nephrolithiasis with calculi measuring up to 6 mm. Indistinct thick walled bladder circumferentially. Stomach/Bowel: Prominent rectal stool burden with trace mesorectal fat edema. No obstruction. Diverticulosis without diverticulitis. No appendicitis. Reproductive:The vagina is anteriorly displaced and contains fluid. The endometrial cavity is gas-filled and distended. The rectum is in close continuity to the vagina and posterior uterine body. Vascular/Lymphatic: Diffuse atherosclerotic calcification No mass or adenopathy. Other: No ascites or pneumoperitoneum. Musculoskeletal: Advanced and diffuse disc and facet degeneration with lumbar levoscoliosis. Remote T12 compression fracture with moderate retropulsion. Status post vertebroplasty. IMPRESSION: 1. Cystitis. 2. Gas  distended endometrial cavity and fluid in the upper vagina. This implies a rectovaginal/uterine fistula. 3. 4 cm opacity in the subpleural left lower lobe could reflect pneumonia or mass. If pneumonia symptoms followup PA and lateral chest X-ray is recommended in 3-4 weeks following trial of antibiotic therapy to ensure resolution and exclude underlying malignancy. 4. Rectal distention with stool, possible developing impaction. 5. Right nephrolithiasis. Electronically Signed   By: Marnee Spring M.D.   On: 07/24/2016 22:23   Dg Chest 2 View  Result Date: 07/24/2016 CLINICAL DATA:  Cough, ingested foreign body EXAM: CHEST  2 VIEW COMPARISON:  CT chest dated 10/06/2007 FINDINGS: Increased interstitial markings. Mild bibasilar opacities, likely atelectasis. No focal consolidation. No pleural effusion or pneumothorax. Cardiomegaly. Mild degenerative changes of the visualized thoracolumbar spine. IMPRESSION: No evidence of acute cardiopulmonary disease. Electronically Signed   By: Charline BillsSriyesh  Krishnan M.D.   On: 07/24/2016 17:16   Dg Chest Port 1 View  Result Date: 07/26/2016 CLINICAL DATA:  Dyspnea today. EXAM: PORTABLE CHEST 1 VIEW COMPARISON:  Frontal and lateral views 07/24/2016, included portions from lung bases 07/24/2016 FINDINGS: Low lung volumes. The 4 cm left lower lobe rounded opacity is not seen radiographically. Unchanged right basilar atelectasis, increased left basilar opacity. Heart size and mediastinal contours are unchanged with thoracic aortic atherosclerosis. No developing pleural effusion or evidence of pneumothorax. Kyphoplasty at the thoracolumbar junction. IMPRESSION: Low lung volumes. Increasing left basilar opacity which may be atelectasis or aspiration. The rounded subpleural opacity in the left lower lobe on CT is not well seen radiographically. Electronically Signed   By: Rubye OaksMelanie  Ehinger M.D.   On: 07/26/2016 03:48   Dg Swallowing Func-speech Pathology  Result Date:  07/25/2016 Objective Swallowing Evaluation: Type of Study: MBS-Modified Barium Swallow Study Patient Details Name: Margit Bandaudrey Rieth MRN: [id number] Date of Birth: 03/14/1921 Today's Date: 07/25/2016 Time: SLP Start Time (ACUTE ONLY): 1345-SLP Stop Time (ACUTE ONLY): 1430 SLP Time Calculation (min) (ACUTE ONLY): 45 min Past Medical History: Past Medical History: Diagnosis Date . Abnormality of gait and mobility  . Depression  . Gastroesophageal reflux disease without esophagitis  . Hereditary and idiopathic neuropathy  . Hypothyroidism  . Irregular sleep-wake rhythm, nonorganic origin  . Osteoarthritis  . Osteoporosis  . Vertigo  . Vitamin B 12 deficiency  Past Surgical History: Past Surgical History: Procedure Laterality Date . ALLOGRAFT APPLICATION  2008 . CHOLECYSTECTOMY   . COSMETIC SURGERY   . ORIF NONUNION HUMERUS FRACTURE Left   OP1 . t12 vertebroplasty   HPI: 82 yo [facility] due [facility] concerns for pneumonia.  Daughter reports this is her mother's first pna.  Pt with PMH + for dementia, GERD without esophagitis, fall with broken arm, decreased mobility status.  Per daughter, pt has had cough since July of this year [facility]  Pt diet changed to nectar on 8/21 but daughter reports no improvement with cough during intake.  Daughter reports mother normally feeds herself.  Good appetitie until the last few weeks reported.  Due to pt's dementia, her historical ability is limited at best.  Evaluation ordered in hospital.   Subjective: Pt seen in radiology for MBS. Daughter present Assessment / Plan / Recommendation CHL IP CLINICAL IMPRESSIONS 07/25/2016 Therapy Diagnosis Mild oral phase dysphagia;Mild pharyngeal phase dysphagia Clinical Impression Mild oropharyngeal dysphagia, characterized orally by piecemeal swallow of larger bolus of puree, which required cue to dry swallow. Pharyngeally, pt exhibited delayed swallow reflex due to decreased sensation for primary trigger  site. Trigger noted at the vallecula on puree and solid, and at the pyriform sinus on thin liquid via cup and straw. Post-swallow vallecular residue was noted only on the larger bolus of puree consistency, and cued dry swallow effectively cleared residue. No penetration or aspiration of any consistency noted.  Esophageal sweep revealed slow clearing, increasing aspiration risk after the swallow. Pt's daughter was present during this assessment, and was provided with written dietary and behavioral strategies to manage esophageal dysmotility. Recommend soft solids with chopped meats, thin liquids via cup or straw. Based on performance today, thickener is  not needed at this time. Recommend adherence to safe swallow precautions and strategies for managment of esophageal dysmotility. Pt may benefit from cueing during the meal, given decreased recall.  Impact on safety and function Mild aspiration risk   CHL IP TREATMENT RECOMMENDATION 07/25/2016 Treatment Recommendations No treatment recommended at this time   Prognosis 07/25/2016 Prognosis for Safe Diet Advancement Fair Barriers to Reach Goals Cognitive deficits CHL IP DIET RECOMMENDATION 07/25/2016 SLP Diet Recommendations Dysphagia 3 (Mech soft) solids;Thin liquid Liquid Administration via Cup;Straw Medication Administration Whole meds with liquid Compensations Minimize environmental distractions;Slow rate;Small sips/bites;Follow solids with liquid;Multiple dry swallows after each bite/sip Postural Changes Remain semi-upright after after feeds/meals (Comment);Seated upright at 90 degrees   CHL IP OTHER RECOMMENDATIONS 07/25/2016 Recommended Consults Consider esophageal assessment Oral Care Recommendations Oral care BID   CHL IP FOLLOW UP RECOMMENDATIONS 07/25/2016 Follow up Recommendations 24 hour supervision/assistance    CHL IP ORAL PHASE 07/25/2016 Oral Phase Impaired Oral - Puree Piecemeal swallowing  CHL IP PHARYNGEAL PHASE 07/25/2016 Pharyngeal Phase Impaired  Pharyngeal- Thin Cup Delayed swallow initiation-pyriform sinuses Pharyngeal- Thin Straw Delayed swallow initiation-pyriform sinuses Pharyngeal- Puree Delayed swallow initiation-vallecula;Pharyngeal residue - valleculae Pharyngeal- Mechanical Soft Delayed swallow initiation-vallecula  CHL IP CERVICAL ESOPHAGEAL PHASE 07/25/2016 Cervical Esophageal Phase Impaired Leigh Aurora 07/25/2016, 3:05 PM Celia B. Bueche, MSP, CCC-SLP [id number]               Assessment/Plan  1. HCAP (healthcare-associated pneumonia) ? Resolution due to adventitious lung sounds and hypoxia(resolved now) This is difficult to evaluate outpt as the pna vs mass was noted on CT rather than xray.  She does not have fever, cough, or purulent sputum so will hold off on any additional antibiotics but bear in mind that she was treated as CAP with amox and zithromax, may need Augmentin vs quinolone therapy if xray is revealing or symptoms arise. I left a message and email with her daughter [name].    2. Dysphagia Continue D 3 diet with thin liquid per ST Oral care after meals  3. Cerumen impaction, bilateral Initial protocol with debrox and irrigation  4. Dementia without behavioral disturbance Advanced, remains on aricept and namenda ? benefit  5. Allergic rhinitis, unspecified allergic rhinitis type Continue Flonase and Zyrtec  6. Chronic UTI Has resumed Ceftin 250 mg qd  7. Advanced care planning Discussion with her daughter regarding avoidance of additional hospitalizations  Spent 60 min on this case in review of the records, talking with staff/resident, and communication/counseling with her daughter  Family/ staff Communication: staff  Labs/tests ordered:  CBC BMP, CXR 2 view

## 2016-08-15 LAB — TSH: TSH: 2.5 u[IU]/mL (ref <=5.90)

## 2016-09-08 ENCOUNTER — Non-Acute Institutional Stay (SKILLED_NURSING_FACILITY): Payer: Medicare Other | Admitting: Adult Health

## 2016-09-08 ENCOUNTER — Encounter: Payer: Self-pay | Admitting: Adult Health

## 2016-09-08 DIAGNOSIS — F0280 Dementia in other diseases classified elsewhere without behavioral disturbance: Secondary | ICD-10-CM | POA: Diagnosis not present

## 2016-09-08 DIAGNOSIS — G2581 Restless legs syndrome: Secondary | ICD-10-CM | POA: Diagnosis not present

## 2016-09-08 DIAGNOSIS — G309 Alzheimer's disease, unspecified: Secondary | ICD-10-CM

## 2016-09-08 DIAGNOSIS — I5032 Chronic diastolic (congestive) heart failure: Secondary | ICD-10-CM

## 2016-09-08 DIAGNOSIS — I10 Essential (primary) hypertension: Secondary | ICD-10-CM | POA: Diagnosis not present

## 2016-09-08 DIAGNOSIS — E039 Hypothyroidism, unspecified: Secondary | ICD-10-CM

## 2016-09-08 NOTE — Progress Notes (Signed)
Patient ID: Ellen Hughes, female   DOB: [birth year], 82 y.o.   MRN: [id number]    Nursing Home Location:    [facility]  Code Status: DNR  Patient Care Team: Kermit Balo, DO as PCP - General (Geriatric Medicine) Melvenia Needles, MD as Consulting Physician (Ophthalmology) Nadara Mustard, MD as Consulting Physician (Orthopedic Surgery) Donzetta Starch, MD as Consulting Physician (Dermatology) Thana Farr, MD as Consulting Physician (Neurology) Fletcher Anon, NP as Nurse Practitioner (Nurse Practitioner)  Goals of care: Advanced Directive information Advanced Directives 09/08/2016  Does patient have an advance directive? Yes  Type of Advance Directive Out of facility DNR (pink MOST or yellow form);Healthcare Power of Attorney  Does patient want to make changes to advanced directive? -  Copy of advanced directive(s) in chart? Yes  Pre-existing out of facility DNR order (yellow form or pink MOST form) Yellow form placed in chart (order not valid for inpatient use)     Place of Service: SNF ([facility]  Chief Complaint  Patient presents with  . Medical Management of Chronic Issues    HPI:  82 y.o.  residing at [facility]. I am here to review her chronic medical issues.  VS have been stable over the past month. Weight has remained stable at 192 lbs. She was treated for pna in August. CXR on 9/14 showed no acute findings. No reports of cough, fever, sob. Currently on a regular diet and tolerating well.   Echo during her hospital stay showed grade 1 diastolic dysfunction. She has chronic edema in BLE but no sob. Non ambulatory.  MMSE 13/30 on 07/29/16 on aricept and namenda Synthroid increased to 88 mcg on 07/01/16 F/U TSH 2.50 BP controlled Takes mirapex nightly and denies restless legs.     Review of Systems:  Review of Systems  Constitutional: Negative for activity change, appetite change, chills, diaphoresis, fatigue, fever and unexpected weight  change.  HENT: Negative for congestion.   Respiratory: Negative for cough, shortness of breath and wheezing.   Cardiovascular: Positive for leg swelling. Negative for chest pain and palpitations.  Gastrointestinal: Negative for abdominal distention, abdominal pain, constipation and diarrhea.  Genitourinary: Negative for difficulty urinating and dysuria.  Musculoskeletal: Positive for gait problem. Negative for arthralgias, back pain, joint swelling and myalgias.  Skin: Negative for rash and wound.  Neurological: Negative for dizziness, tremors, seizures, syncope, facial asymmetry, speech difficulty, weakness, light-headedness, numbness and headaches.  Psychiatric/Behavioral: Positive for confusion. Negative for agitation and behavioral problems.    Medications: Patient's Medications  New Prescriptions   No medications on file  Previous Medications   ACETAMINOPHEN (TYLENOL) 325 MG TABLET    Take 650 mg by mouth every 6 (six) hours as needed (pain.).    AMOXICILLIN (AMOXIL) 500 MG CAPSULE    Take 1 capsule (500 mg total) by mouth every 8 (eight) hours.   ASPIRIN 81 MG CHEWABLE TABLET    Chew by mouth daily.   AZITHROMYCIN (ZITHROMAX) 500 MG TABLET    Take 1 tablet (500 mg total) by mouth daily.   CALCIUM CARBONATE-VITAMIN D 600-400 MG-UNIT PER TABLET    Take 1 tablet by mouth daily.   CETIRIZINE (ZYRTEC) 5 MG TABLET    Take 5 mg by mouth every evening.   CHOLECALCIFEROL (VITAMIN D3) 2000 UNITS TABS    Take by mouth. Take one each morning   DONEPEZIL (ARICEPT) 10 MG TABLET    Take 10 mg by mouth at bedtime. For memory   FLUTICASONE (FLONASE) 50  MCG/ACT NASAL SPRAY    Place 1 spray into both nostrils. Twice daily   FOOD THICKENER (THICK IT) POWD    Take by mouth as needed (one week trial of nectar-thickened liquids until 07/28/16. No straws.).   HYDROCHLOROTHIAZIDE (MICROZIDE) 12.5 MG CAPSULE    Take 12.5 mg by mouth daily.   LEVOTHYROXINE (SYNTHROID, LEVOTHROID) 88 MCG TABLET    Take 88 mcg  by mouth daily before breakfast.   MEMANTINE (NAMENDA) 10 MG TABLET    Take 10 mg by mouth 2 (two) times daily.   POLYETHYLENE GLYCOL (MIRALAX / GLYCOLAX) PACKET    Take 17 g by mouth daily.   PRAMIPEXOLE (MIRAPEX) 0.25 MG TABLET    Take 0.25 mg by mouth. Take one tablet at bedtime   PROBIOTIC PRODUCT (ALIGN) 4 MG CAPS    Take 4 mg by mouth. Take one each morning   SERTRALINE (ZOLOFT) 50 MG TABLET    Take 50 mg by mouth daily.   VITAMIN B-12 (CYANOCOBALAMIN) 1000 MCG TABLET    Take 1,000 mcg by mouth daily.   Modified Medications   No medications on file  Discontinued Medications   No medications on file     Physical Exam:  Vitals:   09/08/16 1551  BP: 136/80  Pulse: 80  Resp: 20  Temp: 97.2 F (36.2 C)  SpO2: 94%  Weight: 192 lb 6.4 oz (87.3 kg)    Physical Exam  Constitutional: No distress.  HENT:  Head: Normocephalic and atraumatic.  Neck: Normal range of motion. Neck supple.  Cardiovascular: Normal rate and regular rhythm.   BLE edema +1  Pulmonary/Chest: Effort normal. No respiratory distress. She has no wheezes. She has rales (LLL).  Abdominal: Soft. Bowel sounds are normal.  Lymphadenopathy:    She has no cervical adenopathy.  Neurological: She is alert. No cranial nerve deficit.  Oriented to self and place only. Able to f/c  Skin: Skin is warm and dry. She is not diaphoretic.  Psychiatric: Affect normal.    Wt Readings from Last 3 Encounters:  09/08/16 192 lb 6.4 oz (87.3 kg)  08/14/16 193 lb 12.8 oz (87.9 kg)  07/29/16 195 lb (88.5 kg)     Labs reviewed/Significant Diagnostic Results:  Basic Metabolic Panel:  Recent Labs  60/45/4008/24/17 1638 07/26/16 1127 08/14/16  NA 143 138 143  K 3.5 3.8 3.6  CL 100* 101  --   CO2 35* 30  --   GLUCOSE 114* 117*  --   BUN 18 12 21   CREATININE 1.00 0.71 1.0  CALCIUM 10.1 8.8*  --    Liver Function Tests:  Recent Labs  07/24/16 1638  AST 21  ALT 20  ALKPHOS 93  BILITOT 0.5  PROT 7.7  ALBUMIN 3.8     Recent Labs  07/24/16 1638  LIPASE 18   No results for input(s): AMMONIA in the last 8760 hours. CBC:  Recent Labs  07/24/16 1638 07/26/16 1127 08/14/16  WBC 9.6 8.5 9.9  NEUTROABS 6.4  --   --   HGB 14.6 12.7 14.8  HCT 46.3* 41.6 45  MCV 98.7 101.7*  --   PLT 374 280 377   CBG: No results for input(s): GLUCAP in the last 8760 hours. TSH:  Recent Labs  08/15/16  TSH 2.50   A1C: No results found for: HGBA1C Lipid Panel: No results for input(s): CHOL, HDL, LDLCALC, TRIG, CHOLHDL, LDLDIRECT in the last 8760 hours.     Assessment/Plan  1. Essential hypertension Controlled HCTZ  12.5 mg qd  2. Hypothyroidism, unspecified type Synthroid 88 mcg po qd  3. Alzheimer's dementia without behavioral disturbance, unspecified timing of dementia onset Continue namenda and aricept  4. CHF (congestive heart failure), NYHA class I, chronic, diastolic (HCC) Stable Monitor weights Compression hose in am and off in pm  5. Restless legs Continue mirapex 0.25 mg qhs    Labs/tests ordered NA   Peggye Ley, ANP [facility] [phone]

## 2016-10-06 ENCOUNTER — Encounter: Payer: Self-pay | Admitting: Adult Health

## 2016-10-06 ENCOUNTER — Non-Acute Institutional Stay (SKILLED_NURSING_FACILITY): Payer: Medicare Other | Admitting: Adult Health

## 2016-10-06 DIAGNOSIS — I10 Essential (primary) hypertension: Secondary | ICD-10-CM | POA: Diagnosis not present

## 2016-10-06 DIAGNOSIS — F0280 Dementia in other diseases classified elsewhere without behavioral disturbance: Secondary | ICD-10-CM | POA: Diagnosis not present

## 2016-10-06 DIAGNOSIS — R1312 Dysphagia, oropharyngeal phase: Secondary | ICD-10-CM | POA: Diagnosis not present

## 2016-10-06 DIAGNOSIS — I5032 Chronic diastolic (congestive) heart failure: Secondary | ICD-10-CM | POA: Diagnosis not present

## 2016-10-06 DIAGNOSIS — G309 Alzheimer's disease, unspecified: Secondary | ICD-10-CM

## 2016-10-06 DIAGNOSIS — I679 Cerebrovascular disease, unspecified: Secondary | ICD-10-CM | POA: Diagnosis not present

## 2016-10-06 NOTE — Progress Notes (Signed)
Patient ID: Ellen Hughes, female   DOB: 06/10/1921, 82 y.o.   MRN: [id number]    Nursing Home Location:    [facility]  Code Status: DNR  Patient Care Team: Kermit Baloiffany L Reed, DO as PCP - General (Geriatric Medicine) Melvenia NeedlesLeon Cashwell, MD as Consulting Physician (Ophthalmology) Nadara MustardMarcus V Duda, MD as Consulting Physician (Orthopedic Surgery) Donzetta Starchrew Jones, MD as Consulting Physician (Dermatology) Thana FarrLeslie Reynolds, MD as Consulting Physician (Neurology) Fletcher Anonhristina Brenleigh Collet, NP as Nurse Practitioner (Nurse Practitioner)  Goals of care: Advanced Directive information Advanced Directives 10/06/2016  Does patient have an advance directive? Yes  Type of Advance Directive Out of facility DNR (pink MOST or yellow form);Healthcare Power of Attorney  Does patient want to make changes to advanced directive? -  Copy of advanced directive(s) in chart? Yes  Pre-existing out of facility DNR order (yellow form or pink MOST form) Yellow form placed in chart (order not valid for inpatient use)     Place of Service: SNF (31)  Chief Complaint  Patient presents with  . Medical Management of Chronic Issues    HPI:  82 y.o.  [facility] I am here to review her chronic medical issues.  VS have been stable over the past month. Weight has decreased by 12 lbs in the past month, which would be desired given her obesity. However, the staff is going to reweigh her as there is some concern that it may not be accurate.  No complaints regarding her care from the staff.  Echo during her hospital stay showed grade 1 diastolic dysfunction. She has chronic edema in BLE but no sob. Non ambulatory. Wears compression hose. MMSE 13/30 on 07/29/16 on aricept and namenda BP slightly elevated today, currently on HCTZ 12. 5qd Denies symptoms of depression, currently on zoloft 50 mg qd Hx of dysphagia, currently on D3 with thin liquids tolerating well .Sats 90-92%, required 02 one time in the  past month but was only used prn.  No fever. Hx of remote CVA and cerebrovascular dz, on aspirin 81 mg, no new events   Review of Systems:  Review of Systems  Constitutional: Positive for unexpected weight change. Negative for activity change, appetite change, chills, diaphoresis, fatigue and fever.  HENT: Negative for congestion.   Respiratory: Negative for cough, shortness of breath and wheezing.   Cardiovascular: Positive for leg swelling. Negative for chest pain and palpitations.  Gastrointestinal: Negative for abdominal distention, abdominal pain, constipation and diarrhea.  Genitourinary: Negative for difficulty urinating and dysuria.  Musculoskeletal: Positive for gait problem. Negative for arthralgias, back pain, joint swelling and myalgias.  Skin: Negative for rash and wound.  Neurological: Negative for dizziness, tremors, seizures, syncope, facial asymmetry, speech difficulty, weakness, light-headedness, numbness and headaches.  Psychiatric/Behavioral: Positive for confusion. Negative for agitation and behavioral problems.    Medications: Patient's Medications  New Prescriptions   No medications on file  Previous Medications   ACETAMINOPHEN (TYLENOL) 325 MG TABLET    Take 650 mg by mouth every 6 (six) hours as needed (pain.).    ASPIRIN 81 MG CHEWABLE TABLET    Chew by mouth daily.   CALCIUM CARBONATE-VITAMIN D 600-400 MG-UNIT PER TABLET    Take 1 tablet by mouth daily.   CEFUROXIME (CEFTIN) 250 MG TABLET    Take 250 mg by mouth daily.   CETIRIZINE (ZYRTEC) 5 MG TABLET    Take 5 mg by mouth every evening.   CHOLECALCIFEROL (VITAMIN D3) 2000 UNITS TABS    Take by  mouth. Take one each morning   DONEPEZIL (ARICEPT) 10 MG TABLET    Take 10 mg by mouth at bedtime. For memory   FLUTICASONE (FLONASE) 50 MCG/ACT NASAL SPRAY    Place 1 spray into both nostrils. Twice daily   FOOD THICKENER (THICK IT) POWD    Take by mouth as needed (one week trial of nectar-thickened liquids until  07/28/16. No straws.).   HYDROCHLOROTHIAZIDE (MICROZIDE) 12.5 MG CAPSULE    Take 12.5 mg by mouth daily.   LEVOTHYROXINE (SYNTHROID, LEVOTHROID) 88 MCG TABLET    Take 88 mcg by mouth daily before breakfast.   MEMANTINE (NAMENDA) 10 MG TABLET    Take 10 mg by mouth 2 (two) times daily.   POLYETHYLENE GLYCOL (MIRALAX / GLYCOLAX) PACKET    Take 17 g by mouth daily.   PRAMIPEXOLE (MIRAPEX) 0.25 MG TABLET    Take 0.25 mg by mouth. Take one tablet at bedtime   PROBIOTIC PRODUCT (ALIGN) 4 MG CAPS    Take 4 mg by mouth. Take one each morning   SERTRALINE (ZOLOFT) 50 MG TABLET    Take 50 mg by mouth daily.   VITAMIN B-12 (CYANOCOBALAMIN) 1000 MCG TABLET    Take 1,000 mcg by mouth daily.   Modified Medications   No medications on file  Discontinued Medications   AMOXICILLIN (AMOXIL) 500 MG CAPSULE    Take 1 capsule (500 mg total) by mouth every 8 (eight) hours.   AZITHROMYCIN (ZITHROMAX) 500 MG TABLET    Take 1 tablet (500 mg total) by mouth daily.     Physical Exam:  Vitals:   10/06/16 1634  BP: (!) 159/71  Pulse: 66  Resp: 20  Temp: 97.7 F (36.5 C)  SpO2: 94%  Weight: 180 lb (81.6 kg)  Weight: 180 lb (81.6 kg)  Wt Readings from Last 3 Encounters:  10/06/16 180 lb (81.6 kg)  09/08/16 192 lb 6.4 oz (87.3 kg)  08/14/16 193 lb 12.8 oz (87.9 kg)    Physical Exam  Constitutional: No distress.  HENT:  Head: Normocephalic and atraumatic.  Eyes: Conjunctivae are normal. Pupils are equal, round, and reactive to light. Right eye exhibits no discharge. Left eye exhibits no discharge.  Neck: Normal range of motion. Neck supple.  Cardiovascular: Normal rate and regular rhythm.   BLE edema +1  Pulmonary/Chest: Effort normal. No respiratory distress. She has no wheezes. She has rales (Bilat bases and mid lung).  Abdominal: Soft. Bowel sounds are normal. She exhibits no distension.  Musculoskeletal: She exhibits no edema or tenderness.  Lymphadenopathy:    She has no cervical adenopathy.    Neurological: She is alert. No cranial nerve deficit.  Oriented to self and place only. Able to f/c  Skin: Skin is warm and dry. She is not diaphoretic.  Psychiatric: Affect normal.    Wt Readings from Last 3 Encounters:  10/06/16 180 lb (81.6 kg)  09/08/16 192 lb 6.4 oz (87.3 kg)  08/14/16 193 lb 12.8 oz (87.9 kg)     Labs reviewed/Significant Diagnostic Results:  Basic Metabolic Panel:  Recent Labs  16/09/9607/24/17 1638 07/26/16 1127 08/14/16  NA 143 138 143  K 3.5 3.8 3.6  CL 100* 101  --   CO2 35* 30  --   GLUCOSE 114* 117*  --   BUN 18 12 21   CREATININE 1.00 0.71 1.0  CALCIUM 10.1 8.8*  --    Liver Function Tests:  Recent Labs  07/24/16 1638  AST 21  ALT 20  ALKPHOS 93  BILITOT 0.5  PROT 7.7  ALBUMIN 3.8    Recent Labs  07/24/16 1638  LIPASE 18   No results for input(s): AMMONIA in the last 8760 hours. CBC:  Recent Labs  07/24/16 1638 07/26/16 1127 08/14/16  WBC 9.6 8.5 9.9  NEUTROABS 6.4  --   --   HGB 14.6 12.7 14.8  HCT 46.3* 41.6 45  MCV 98.7 101.7*  --   PLT 374 280 377   CBG: No results for input(s): GLUCAP in the last 8760 hours. TSH:  Recent Labs  08/15/16  TSH 2.50   A1C: No results found for: HGBA1C Lipid Panel: No results for input(s): CHOL, HDL, LDLCALC, TRIG, CHOLHDL, LDLDIRECT in the last 8760 hours.     Assessment/Plan  1. Cerebrovascular disease Stable Continue aspirin 81 mg qd  2. Essential hypertension Slight elevation, BP goal <150/90 due to her age Continue current meds and monitor BP  3. Alzheimer's dementia without behavioral disturbance, unspecified timing of dementia onset Progressive memory loss and functional losses Continue Namenda 10 mg BID  4. Oropharyngeal dysphagia Mild Stable with no new bouts of pna Continue modified diet  5. CHF (congestive heart failure), NYHA class I, chronic, diastolic (HCC) Weight trending down Monitor weight and volume status Monitor BMP  periodically    Peggye Ley, ANP [facility] [phone]

## 2016-10-08 DIAGNOSIS — R1310 Dysphagia, unspecified: Secondary | ICD-10-CM | POA: Insufficient documentation

## 2016-10-08 DIAGNOSIS — I679 Cerebrovascular disease, unspecified: Secondary | ICD-10-CM | POA: Insufficient documentation

## 2016-10-16 ENCOUNTER — Non-Acute Institutional Stay (SKILLED_NURSING_FACILITY): Payer: Medicare Other | Admitting: Adult Health

## 2016-10-16 DIAGNOSIS — T17908A Unspecified foreign body in respiratory tract, part unspecified causing other injury, initial encounter: Secondary | ICD-10-CM | POA: Diagnosis not present

## 2016-10-16 DIAGNOSIS — R1312 Dysphagia, oropharyngeal phase: Secondary | ICD-10-CM

## 2016-10-16 LAB — CBC AND DIFFERENTIAL
HEMATOCRIT: 45 % (ref 36–46)
Hemoglobin: 14 g/dL (ref 12.0–16.0)
Platelets: 305 10*3/uL (ref 150–399)
WBC: 10.7 10^3/mL

## 2016-10-16 LAB — BASIC METABOLIC PANEL
BUN: 26 mg/dL — AB (ref 4–21)
Creatinine: 1.1 mg/dL (ref 0.5–1.1)
GLUCOSE: 146 mg/dL
Potassium: 4.1 mmol/L (ref 3.4–5.3)
SODIUM: 142 mmol/L (ref 137–147)

## 2016-10-16 NOTE — Progress Notes (Signed)
[facility]   Place of Service:  S[facility] (31) Provider:   Peggye Leyhristy Sharece Fleischhacker, ANP [facility] [phone]   REED, Elmarie ShileyIFFANY, DO  Patient Care Team: Kermit Baloiffany L Reed, DO as PCP - General (Geriatric Medicine) Melvenia NeedlesLeon Cashwell, MD as Consulting Physician (Ophthalmology) Nadara MustardMarcus V Duda, MD as Consulting Physician (Orthopedic Surgery) Donzetta Starchrew Jones, MD as Consulting Physician (Dermatology) Thana FarrLeslie Reynolds, MD as Consulting Physician (Neurology) Fletcher Anonhristina Saory Carriero, NP as Nurse Practitioner (Nurse Practitioner)  Extended Emergency Contact Information Primary Emergency Contact: [facility] HospitalBarker,[name] Address: [address]          [place], [place] [id number] [place] Home Phone: [phone] Mobile Phone: [phone] Relation: Daughter  Code Status:  DNR Goals of care: Advanced Directive information Advanced Directives 10/17/2016  Does patient have an advance directive? Yes  Type of Advance Directive Out of facility DNR (pink MOST or yellow form);Healthcare Power of Attorney  Does patient want to make changes to advanced directive? -  Copy of advanced directive(s) in chart? Yes  Pre-existing out of facility DNR order (yellow form or pink MOST form) Yellow form placed in chart (order not valid for inpatient use)     Chief Complaint  Patient presents with  . Acute Visit    f/u shaking and low 02 sat    HPI:  Pt is a 82 y.o. female seen today for an acute visit for lethargy, shaking, and low 02 sats on 11/14.  She has been intermittently needing 02 at 2L.  Her sats were 85% on RA on 11/14 and she was shaking and difficult to arouse.  She is more alert now with a non productive cough, no fever, and 90% on RA.  She was treated for PNA in the hospital in August  with amoxicillin and zithromax. She has mild dysphagia and is currently on a D3 diet with thin liquids.  She does not cough with meals.  Her WBC count returned at 10.7 with a left shift.  Her  daughter has been concerned in the past that her pna during her hospital stay was not evident on xray, but showed up on CT scan in the LLL.  CXR was a 1 view here and was rotated, it showed no acute abnormality.   Past Medical History:  Diagnosis Date  . Abnormality of gait and mobility   . Depression   . Gastroesophageal reflux disease without esophagitis   . Hereditary and idiopathic neuropathy   . Hypothyroidism   . Irregular sleep-wake rhythm, nonorganic origin   . Osteoarthritis   . Osteoporosis   . Vertigo   . Vitamin B 12 deficiency    Past Surgical History:  Procedure Laterality Date  . ALLOGRAFT APPLICATION  2008  . CHOLECYSTECTOMY    . COSMETIC SURGERY    . ORIF NONUNION HUMERUS FRACTURE Left    OP1  . t12 vertebroplasty      Allergies  Allergen Reactions  . Codeine   . Morphine And Related       Medication List       Accurate as of 10/16/16 11:59 PM. Always use your most recent med list.          acetaminophen 325 MG tablet Commonly known as:  TYLENOL Take 650 mg by mouth every 6 (six) hours as needed (pain.).   ALIGN 4 MG Caps Take 4 mg by mouth. Take one each morning   aspirin 81 MG chewable tablet Chew by mouth daily.   Calcium Carbonate-Vitamin D  600-400 MG-UNIT tablet Take 1 tablet by mouth daily.   cefUROXime 250 MG tablet Commonly known as:  CEFTIN Take 250 mg by mouth daily.   cetirizine 5 MG tablet Commonly known as:  ZYRTEC Take 5 mg by mouth every evening.   donepezil 10 MG tablet Commonly known as:  ARICEPT Take 10 mg by mouth at bedtime. For memory   fluticasone 50 MCG/ACT nasal spray Commonly known as:  FLONASE Place 1 spray into both nostrils. Twice daily   food thickener Powd Commonly known as:  THICK IT Take by mouth as needed (one week trial of nectar-thickened liquids until 07/28/16. No straws.).   hydrochlorothiazide 12.5 MG capsule Commonly known as:  MICROZIDE Take 12.5 mg by mouth daily.   levothyroxine 88  MCG tablet Commonly known as:  SYNTHROID, LEVOTHROID Take 88 mcg by mouth daily before breakfast.   memantine 10 MG tablet Commonly known as:  NAMENDA Take 10 mg by mouth 2 (two) times daily.   polyethylene glycol packet Commonly known as:  MIRALAX / GLYCOLAX Take 17 g by mouth daily.   pramipexole 0.25 MG tablet Commonly known as:  MIRAPEX Take 0.25 mg by mouth. Take one tablet at bedtime   sertraline 50 MG tablet Commonly known as:  ZOLOFT Take 25 mg by mouth daily.   vitamin B-12 1000 MCG tablet Commonly known as:  CYANOCOBALAMIN Take 1,000 mcg by mouth daily.   Vitamin D3 2000 units Tabs Take by mouth. Take one each morning       Review of Systems  Constitutional: Negative for activity change, appetite change, chills, diaphoresis, fatigue, fever and unexpected weight change.  HENT: Positive for trouble swallowing. Negative for congestion, sinus pressure, sneezing, sore throat and voice change.   Respiratory: Positive for cough. Negative for choking, shortness of breath and wheezing.   Cardiovascular: Positive for leg swelling. Negative for chest pain and palpitations.  Gastrointestinal: Negative for abdominal distention, constipation and diarrhea.  Musculoskeletal: Positive for gait problem.  Neurological: Negative for dizziness and facial asymmetry.  Psychiatric/Behavioral: Positive for confusion. Negative for agitation and behavioral problems.    Immunization History  Administered Date(s) Administered  . Influenza Inj Mdck Quad Pf 09/18/2016  . Influenza-Unspecified 09/13/2015  . PPD Test 05/25/2011, 10/14/2011  . Pneumococcal Conjugate-13 08/01/2014  . Pneumococcal Polysaccharide-23 11/27/2011  . Zoster 07/21/2012   Pertinent  Health Maintenance Due  Topic Date Due  . DEXA SCAN  08/07/1988  . INFLUENZA VACCINE  Completed  . PNA vac Low Risk Adult  Completed   Fall Risk  03/07/2015  Falls in the past year? Yes  Number falls in past yr: 1   Functional  Status Survey:    Vitals:   10/17/16 1035  BP: 111/74  Pulse: 96  Resp: 20  Temp: 98.8 F (37.1 C)  SpO2: 90%  Weight: 187 lb (84.8 kg)   Body mass index is 28.43 kg/m. Physical Exam  Constitutional: No distress.  HENT:  Head: Normocephalic and atraumatic.  Right Ear: External ear normal.  Left Ear: External ear normal.  Nose: Nose normal.  Mouth/Throat: Oropharyngeal exudate present.  Eyes: Conjunctivae are normal. Pupils are equal, round, and reactive to light. Right eye exhibits no discharge. Left eye exhibits no discharge.  Neck: No JVD present.  Cardiovascular: Normal rate and regular rhythm.   No murmur heard. BLE edema _+2  Pulmonary/Chest: Effort normal. She has rales.  Lymphadenopathy:    She has no cervical adenopathy.  Neurological: She is alert.  Oriented x2  Skin: Skin is warm and dry. She is not diaphoretic.  Psychiatric: She has a normal mood and affect.  Nursing note and vitals reviewed.   Labs reviewed:  Recent Labs  07/24/16 1638 07/26/16 1127 08/14/16 10/16/16  NA 143 138 143 142  K 3.5 3.8 3.6 4.1  CL 100* 101  --   --   CO2 35* 30  --   --   GLUCOSE 114* 117*  --   --   BUN 18 12 21  26*  CREATININE 1.00 0.71 1.0 1.1  CALCIUM 10.1 8.8*  --   --     Recent Labs  07/24/16 1638  AST 21  ALT 20  ALKPHOS 93  BILITOT 0.5  PROT 7.7  ALBUMIN 3.8    Recent Labs  07/24/16 1638 07/26/16 1127 08/14/16 10/16/16  WBC 9.6 8.5 9.9 10.7  NEUTROABS 6.4  --   --   --   HGB 14.6 12.7 14.8 14.0  HCT 46.3* 41.6 45 45  MCV 98.7 101.7*  --   --   PLT 374 280 377 305   Lab Results  Component Value Date   TSH 2.50 08/15/2016   No results found for: HGBA1C No results found for: CHOL, HDL, LDLCALC, LDLDIRECT, TRIG, CHOLHDL  Significant Diagnostic Results in last 30 days:  No results found.  Assessment/Plan 1. Aspiration into respiratory tract, initial encounter She remains with intermittent low 02 sats since she was treated for pna  during her hospital stay in August.  She was treated as a CAP at that time but should be considered HCAP.   The xray obtained yesterday was rotated and no lateral view was obtained.  Her previous pna was only noted on CT scan in the LLL.  Unfortunately the LLL is not visualized in the most recent images. Considering the left shift in her WBCs and her profound presentation it would be prudent to treat with Augmentin and monitor her 02 sats/VS.   Augmentin 875 mg BID for 7 days with Florastor 1 cap BID for 7 days Hold ceftin while on augmentin  2. Dysphagia -continue current diet regimen -asp prec in place  Family/ staff Communication: discussed with NSG supervisor  Labs/tests ordered:  NA

## 2016-10-17 ENCOUNTER — Encounter: Payer: Self-pay | Admitting: Adult Health

## 2016-11-04 ENCOUNTER — Non-Acute Institutional Stay (SKILLED_NURSING_FACILITY): Payer: Medicare Other | Admitting: Internal Medicine

## 2016-11-04 ENCOUNTER — Encounter: Payer: Self-pay | Admitting: Internal Medicine

## 2016-11-04 DIAGNOSIS — G309 Alzheimer's disease, unspecified: Secondary | ICD-10-CM

## 2016-11-04 DIAGNOSIS — E039 Hypothyroidism, unspecified: Secondary | ICD-10-CM | POA: Diagnosis not present

## 2016-11-04 DIAGNOSIS — I5032 Chronic diastolic (congestive) heart failure: Secondary | ICD-10-CM | POA: Diagnosis not present

## 2016-11-04 DIAGNOSIS — R05 Cough: Secondary | ICD-10-CM

## 2016-11-04 DIAGNOSIS — R1312 Dysphagia, oropharyngeal phase: Secondary | ICD-10-CM

## 2016-11-04 DIAGNOSIS — F0280 Dementia in other diseases classified elsewhere without behavioral disturbance: Secondary | ICD-10-CM

## 2016-11-04 DIAGNOSIS — R053 Chronic cough: Secondary | ICD-10-CM

## 2016-11-04 DIAGNOSIS — T17908A Unspecified foreign body in respiratory tract, part unspecified causing other injury, initial encounter: Secondary | ICD-10-CM | POA: Diagnosis not present

## 2016-11-04 NOTE — Progress Notes (Signed)
Patient ID: Margit Bandaudrey Meunier, female   DOB: 08/10/1921, 82 y.o.   MRN: [id number]  Location:  [facility] Room Number: 134 Place of Service:  SNF (31) Provider:  Anahis Furgeson L. Renato Gailseed, D.O., C.M.D.  Bufford SpikesEED, Ashton Belote, DO  Patient Care Team: Kermit Baloiffany L Akeem Heppler, DO as PCP - General (Geriatric Medicine) Melvenia NeedlesLeon Cashwell, MD as Consulting Physician (Ophthalmology) Nadara MustardMarcus V Duda, MD as Consulting Physician (Orthopedic Surgery) Donzetta Starchrew Jones, MD as Consulting Physician (Dermatology) Thana FarrLeslie Reynolds, MD as Consulting Physician (Neurology) Fletcher Anonhristina Wert, NP as Nurse Practitioner (Nurse Practitioner)  Extended Emergency Contact Information Primary Emergency Contact: [facility],[name] Address: [address]          [place], [place] [address] [place] Home Phone: [phone] Mobile Phone: [phone] Relation: Daughter  Code Status:  DNR Goals of care: Advanced Directive information Advanced Directives 11/04/2016  Does Patient Have a Medical Advance Directive? -  Type of Advance Directive Out of facility DNR (pink MOST or yellow form);Healthcare Power of Attorney  Does patient want to make changes to medical advance directive? -  Copy of Healthcare Power of Attorney in Chart? Yes  Pre-existing out of facility DNR order (yellow form or pink MOST form) Yellow form placed in chart (order not valid for inpatient use)     Chief Complaint  Patient presents with  . Medical Management of Chronic Issues    routine visit    HPI:  Pt is a 82 y.o. female seen today for medical management of chronic diseases.  [place]. She has moderate to severe dementia and requires assist with all ADLs except feeding herself on a typical day. She is still on aricept and namenda. She's also had difficulty with depression and staff question if her medication needs adjustment due to recent tearful spells.  When seen, she had a blank stare when asked questions.  She did not speak much  to me (typically, she does answer me).    Staff continue to report some coughing spells also associated with meals.  She was hospitalized in Sept with aspiration pneumonia,  CHF, and was found to have a possible lung mass on her CT abd/pelvis.  She had recovered from the pneumonia and CHF so no further workup was conducted in the late fall.    She is losing weight.  She weighed 185 lbs when she had weighed 205 lbs a year ago around this time.   She has still been attending activities especially cocktail hour, but is not participating as she once did.  She sits quietly and drinks her wine.  Past Medical History:  Diagnosis Date  . Abnormality of gait and mobility   . Depression   . Gastroesophageal reflux disease without esophagitis   . Hereditary and idiopathic neuropathy   . Hypothyroidism   . Irregular sleep-wake rhythm, nonorganic origin   . Osteoarthritis   . Osteoporosis   . Vertigo   . Vitamin B 12 deficiency    Past Surgical History:  Procedure Laterality Date  . ALLOGRAFT APPLICATION  2008  . CHOLECYSTECTOMY    . COSMETIC SURGERY    . ORIF NONUNION HUMERUS FRACTURE Left    OP1  . t12 vertebroplasty      Allergies  Allergen Reactions  . Codeine   . Morphine And Related       Medication List       Accurate as of 11/04/16  2:46 PM. Always use your most recent med list.  acetaminophen 325 MG tablet Commonly known as:  TYLENOL Take 650 mg by mouth every 6 (six) hours as needed (pain.).   ALIGN 4 MG Caps Take 4 mg by mouth. Take one each morning   aspirin 81 MG chewable tablet Chew by mouth daily.   Calcium Carbonate-Vitamin D 600-400 MG-UNIT tablet Take 1 tablet by mouth daily.   cefUROXime 250 MG tablet Commonly known as:  CEFTIN Take 250 mg by mouth daily.   cetirizine 5 MG tablet Commonly known as:  ZYRTEC Take 5 mg by mouth every evening.   donepezil 10 MG tablet Commonly known as:  ARICEPT Take 10 mg by mouth at bedtime. For memory    fluticasone 50 MCG/ACT nasal spray Commonly known as:  FLONASE Place 1 spray into both nostrils. Twice daily   food thickener Powd Commonly known as:  THICK IT Take by mouth as needed (one week trial of nectar-thickened liquids until 07/28/16. No straws.).   GERI-LANTA 200-200-20 MG/5ML suspension Generic drug:  alum & mag hydroxide-simeth Take 15 mLs by mouth every 4 (four) hours as needed for indigestion or heartburn.   hydrochlorothiazide 12.5 MG capsule Commonly known as:  MICROZIDE Take 12.5 mg by mouth daily.   levothyroxine 88 MCG tablet Commonly known as:  SYNTHROID, LEVOTHROID Take 88 mcg by mouth daily before breakfast.   memantine 10 MG tablet Commonly known as:  NAMENDA Take 10 mg by mouth 2 (two) times daily.   polyethylene glycol packet Commonly known as:  MIRALAX / GLYCOLAX Take 17 g by mouth daily.   pramipexole 0.25 MG tablet Commonly known as:  MIRAPEX Take 0.25 mg by mouth. Take one tablet at bedtime   sertraline 25 MG tablet Commonly known as:  ZOLOFT Take 25 mg by mouth daily.   vitamin B-12 1000 MCG tablet Commonly known as:  CYANOCOBALAMIN Take 1,000 mcg by mouth daily.   Vitamin D3 2000 units Tabs Take by mouth. Take one each morning       Review of Systems  Unable to perform ROS: Dementia (see hpi from nursing)    Immunization History  Administered Date(s) Administered  . Influenza Inj Mdck Quad Pf 09/18/2016  . Influenza-Unspecified 09/13/2015  . PPD Test 05/25/2011, 10/14/2011  . Pneumococcal Conjugate-13 08/01/2014  . Pneumococcal Polysaccharide-23 11/27/2011  . Zoster 07/21/2012   Pertinent  Health Maintenance Due  Topic Date Due  . DEXA SCAN  08/07/1988  . INFLUENZA VACCINE  Completed  . PNA vac Low Risk Adult  Completed   Fall Risk  03/07/2015  Falls in the past year? Yes  Number falls in past yr: 1   Functional Status Survey:    Vitals:   11/04/16 1436  BP: 131/70  Pulse: 80  Resp: (!) 23  Temp: 98.1 F (36.7  C)  TempSrc: Oral  SpO2: 90%  Weight: 185 lb (83.9 kg)   Body mass index is 28.13 kg/m. Physical Exam  Constitutional: She appears well-developed and well-nourished. No distress.  Obese female seated in wheelchair  Cardiovascular: Normal rate, regular rhythm, normal heart sounds and intact distal pulses.   Compression hose on  Pulmonary/Chest: Effort normal. No respiratory distress. She has no wheezes. She has no rales.  Some scattered rhonchi anteriorly, has wet cough  Abdominal: Soft. Bowel sounds are normal.  Musculoskeletal: Normal range of motion.  Neurological: She is alert.  Skin: Skin is warm and dry.  Psychiatric:  Flat affect vs prior visits    Labs reviewed:  Recent Labs  07/24/16 1638  07/26/16 1127 08/14/16 10/16/16  NA 143 138 143 142  K 3.5 3.8 3.6 4.1  CL 100* 101  --   --   CO2 35* 30  --   --   GLUCOSE 114* 117*  --   --   BUN 18 12 21  26*  CREATININE 1.00 0.71 1.0 1.1  CALCIUM 10.1 8.8*  --   --     Recent Labs  07/24/16 1638  AST 21  ALT 20  ALKPHOS 93  BILITOT 0.5  PROT 7.7  ALBUMIN 3.8    Recent Labs  07/24/16 1638 07/26/16 1127 08/14/16 10/16/16  WBC 9.6 8.5 9.9 10.7  NEUTROABS 6.4  --   --   --   HGB 14.6 12.7 14.8 14.0  HCT 46.3* 41.6 45 45  MCV 98.7 101.7*  --   --   PLT 374 280 377 305   Lab Results  Component Value Date   TSH 2.50 08/15/2016  Significant Diagnostic Results in last 30 days:  Last few xrays reviewed  Assessment/Plan 1. Aspiration into respiratory tract, initial encounter -cont aspiration precautions -this has been recurrent -has been seen by ST and diet modifications pursued  2. Oropharyngeal dysphagia -causing #1, cont changes  3. Alzheimer's dementia without behavioral disturbance, unspecified timing of dementia onset -remains on aricept and namenda--need to discuss with family if these are still benefiting her at this degree of dementia in SNF  4. CHF (congestive heart failure), NYHA class I,  chronic, diastolic (HCC) -stable recently, had flare during hospitalization likely due to fluid given -cont current regimen and monitor  5. Hypothyroidism, unspecified type - Lab Results  Component Value Date   TSH 2.50 08/15/2016  cont current levothyroxine and monitor  6. Chronic cough -due to dysphagia with aspiration  -cont aspiration precautions and assistance with meals as needed  Family/ staff Communication: discussed with SNF nurse  Labs/tests ordered:  No new

## 2016-12-05 ENCOUNTER — Encounter: Payer: Self-pay | Admitting: Adult Health

## 2016-12-05 ENCOUNTER — Non-Acute Institutional Stay (SKILLED_NURSING_FACILITY): Payer: Medicare Other | Admitting: Adult Health

## 2016-12-05 DIAGNOSIS — I10 Essential (primary) hypertension: Secondary | ICD-10-CM | POA: Diagnosis not present

## 2016-12-05 DIAGNOSIS — F330 Major depressive disorder, recurrent, mild: Secondary | ICD-10-CM | POA: Diagnosis not present

## 2016-12-05 DIAGNOSIS — F0280 Dementia in other diseases classified elsewhere without behavioral disturbance: Secondary | ICD-10-CM

## 2016-12-05 DIAGNOSIS — R1312 Dysphagia, oropharyngeal phase: Secondary | ICD-10-CM | POA: Diagnosis not present

## 2016-12-05 DIAGNOSIS — R0902 Hypoxemia: Secondary | ICD-10-CM | POA: Diagnosis not present

## 2016-12-05 DIAGNOSIS — I5032 Chronic diastolic (congestive) heart failure: Secondary | ICD-10-CM

## 2016-12-05 DIAGNOSIS — G309 Alzheimer's disease, unspecified: Secondary | ICD-10-CM

## 2016-12-05 NOTE — Progress Notes (Signed)
Location:  Medical illustrator of Service:  SNF (31) Provider:   Peggye Ley, ANP [facility] [phone]   REED, Elmarie Shiley, DO  Patient Care Team: Kermit Balo, DO as PCP - General (Geriatric Medicine) Melvenia Needles, MD as Consulting Physician (Ophthalmology) Nadara Mustard, MD as Consulting Physician (Orthopedic Surgery) Donzetta Starch, MD as Consulting Physician (Dermatology) Thana Farr, MD as Consulting Physician (Neurology) Fletcher Anon, NP as Nurse Practitioner (Nurse Practitioner)  Extended Emergency Contact Information Primary Emergency Contact: [facility] Address: [address] [place], [place] [address] [place] Home Phone: [phone] Mobile Phone: [phone] Relation: Daughter  Code Status:  DNR Goals of care: Advanced Directive information Advanced Directives 11/04/2016  Does Patient Have a Medical Advance Directive? -  Type of Advance Directive Out of facility DNR (pink MOST or yellow form);Healthcare Power of Attorney  Does patient want to make changes to medical advance directive? -  Copy of Healthcare Power of Attorney in Chart? Yes  Pre-existing out of facility DNR order (yellow form or pink MOST form) Yellow form placed in chart (order not valid for inpatient use)     Chief Complaint  Patient presents with  . Medical Management of Chronic Issues    HPI:  Pt is a 83 y.o. female seen today for medical management of chronic diseases.   Currently residing in skilled care due to functional losses and dementia.  She is requiring more assistance with feeding and has lost 8 lbs in the past two months.  Zoloft was increased to 50 mg qd due to decreased appetite and crying on 12/21 MMSE 07/29/16 13/30  She was treated for pneumonia in August found on CT of the abd noted in the LLL.  Follow up xrays have been negative but it is important to note that the area was never noted on CXR. She has not  had any cough or purulent sputum in the past month.  She is requiring oxygen most nights due to sats 88%.  No SOB or CP.  She does not have a hx of CHF, recent echo indicated EF 55-60% with grade 1 DD  CT result 07/24/16:  4 cm opacity in the subpleural left lower lobe could reflect pneumonia or mass. If pneumonia symptoms followup PA and lateral chest X-ray is recommended in 3-4 weeks following trial of antibiotic therapy to ensure resolution and exclude underlying Malignancy.  Past Medical History:  Diagnosis Date  . Abnormality of gait and mobility   . Depression   . Gastroesophageal reflux disease without esophagitis   . Hereditary and idiopathic neuropathy   . Hypothyroidism   . Irregular sleep-wake rhythm, nonorganic origin   . Osteoarthritis   . Osteoporosis   . Vertigo   . Vitamin B 12 deficiency    Past Surgical History:  Procedure Laterality Date  . ALLOGRAFT APPLICATION  2008  . CHOLECYSTECTOMY    . COSMETIC SURGERY    . ORIF NONUNION HUMERUS FRACTURE Left    OP1  . t12 vertebroplasty      Allergies  Allergen Reactions  . Codeine   . Morphine And Related     Allergies as of 12/05/2016      Reactions   Codeine    Morphine And Related       Medication List       Accurate as of 12/05/16 10:48 AM. Always use your most recent med list.  acetaminophen 325 MG tablet Commonly known as:  TYLENOL Take 650 mg by mouth every 6 (six) hours as needed (pain.).   ALIGN 4 MG Caps Take 4 mg by mouth. Take one each morning   aspirin 81 MG chewable tablet Chew by mouth daily.   Calcium Carbonate-Vitamin D 600-400 MG-UNIT tablet Take 1 tablet by mouth daily.   cefUROXime 250 MG tablet Commonly known as:  CEFTIN Take 250 mg by mouth daily.   cetirizine 5 MG tablet Commonly known as:  ZYRTEC Take 5 mg by mouth every evening.   donepezil 10 MG tablet Commonly known as:  ARICEPT Take 10 mg by mouth at bedtime. For memory   fluticasone 50 MCG/ACT nasal  spray Commonly known as:  FLONASE Place 1 spray into both nostrils. Twice daily   food thickener Powd Commonly known as:  THICK IT Take by mouth as needed (one week trial of nectar-thickened liquids until 07/28/16. No straws.).   GERI-LANTA 200-200-20 MG/5ML suspension Generic drug:  alum & mag hydroxide-simeth Take 15 mLs by mouth every 4 (four) hours as needed for indigestion or heartburn.   hydrochlorothiazide 12.5 MG capsule Commonly known as:  MICROZIDE Take 12.5 mg by mouth daily.   levothyroxine 88 MCG tablet Commonly known as:  SYNTHROID, LEVOTHROID Take 88 mcg by mouth daily before breakfast.   memantine 10 MG tablet Commonly known as:  NAMENDA Take 10 mg by mouth 2 (two) times daily.   polyethylene glycol packet Commonly known as:  MIRALAX / GLYCOLAX Take 17 g by mouth daily.   pramipexole 0.25 MG tablet Commonly known as:  MIRAPEX Take 0.25 mg by mouth. Take one tablet at bedtime   sertraline 50 MG tablet Commonly known as:  ZOLOFT Take 50 mg by mouth daily.   vitamin B-12 1000 MCG tablet Commonly known as:  CYANOCOBALAMIN Take 1,000 mcg by mouth daily.   Vitamin D3 2000 units Tabs Take by mouth. Take one each morning       Review of Systems  Constitutional: Positive for unexpected weight change. Negative for activity change, appetite change, chills, diaphoresis, fatigue and fever.  HENT: Negative for congestion, sinus pressure, sneezing, sore throat, trouble swallowing and voice change.   Respiratory: Negative for cough, choking, shortness of breath and wheezing.   Cardiovascular: Positive for leg swelling. Negative for chest pain and palpitations.  Gastrointestinal: Negative for abdominal distention, constipation and diarrhea.  Genitourinary: Negative for difficulty urinating.  Musculoskeletal: Positive for gait problem.  Neurological: Negative for dizziness and facial asymmetry.  Psychiatric/Behavioral: Positive for confusion. Negative for agitation  and behavioral problems.    Immunization History  Administered Date(s) Administered  . Influenza Inj Mdck Quad Pf 09/18/2016  . Influenza-Unspecified 09/13/2015  . PPD Test 05/25/2011, 10/14/2011  . Pneumococcal Conjugate-13 08/01/2014  . Pneumococcal Polysaccharide-23 11/27/2011  . Zoster 07/21/2012   Pertinent  Health Maintenance Due  Topic Date Due  . DEXA SCAN  08/07/1988  . INFLUENZA VACCINE  Completed  . PNA vac Low Risk Adult  Completed   Fall Risk  03/07/2015  Falls in the past year? Yes  Number falls in past yr: 1   Functional Status Survey:    Vitals:   12/05/16 1044  BP: 132/74  Pulse: 92  Resp: (!) 22  Temp: 98 F (36.7 C)  SpO2: (!) 88%  Weight: 181 lb (82.1 kg)   Body mass index is 27.52 kg/m.  Wt Readings from Last 3 Encounters:  12/05/16 181 lb (82.1 kg)  11/04/16 185  lb (83.9 kg)  10/17/16 187 lb (84.8 kg)   Physical Exam  Constitutional: No distress.  HENT:  Head: Normocephalic and atraumatic.  Right Ear: External ear normal.  Left Ear: External ear normal.  Nose: Nose normal.  Mouth/Throat: Oropharynx is clear and moist. No oropharyngeal exudate.  Eyes: Conjunctivae are normal. Pupils are equal, round, and reactive to light. Right eye exhibits no discharge. Left eye exhibits no discharge.  Neck: No JVD present.  Cardiovascular: Normal rate and regular rhythm.   No murmur heard. BLE edema _+2  Pulmonary/Chest: Effort normal. She has rales (throughout).  Abdominal: Soft. Bowel sounds are normal.  Lymphadenopathy:    She has no cervical adenopathy.  Neurological: She is alert.  Oriented x2  Skin: Skin is warm and dry. She is not diaphoretic.  Psychiatric: She has a normal mood and affect.  Nursing note and vitals reviewed.   Labs reviewed:  Recent Labs  07/24/16 1638 07/26/16 1127 08/14/16 10/16/16  NA 143 138 143 142  K 3.5 3.8 3.6 4.1  CL 100* 101  --   --   CO2 35* 30  --   --   GLUCOSE 114* 117*  --   --   BUN 18 12 21  26*   CREATININE 1.00 0.71 1.0 1.1  CALCIUM 10.1 8.8*  --   --     Recent Labs  07/24/16 1638  AST 21  ALT 20  ALKPHOS 93  BILITOT 0.5  PROT 7.7  ALBUMIN 3.8    Recent Labs  07/24/16 1638 07/26/16 1127 08/14/16 10/16/16  WBC 9.6 8.5 9.9 10.7  NEUTROABS 6.4  --   --   --   HGB 14.6 12.7 14.8 14.0  HCT 46.3* 41.6 45 45  MCV 98.7 101.7*  --   --   PLT 374 280 377 305   Lab Results  Component Value Date   TSH 2.50 08/15/2016   No results found for: HGBA1C No results found for: CHOL, HDL, LDLCALC, LDLDIRECT, TRIG, CHOLHDL   BNP 83 05/02/16  Significant Diagnostic Results in last 30 days:  No results found.  Assessment/Plan  1. Alzheimer's dementia without behavioral disturbance, unspecified timing of dementia onset -declining memory and functional status Continue on aricept, namenda would not add anything to this scenario as she is already dependent on staff for ADL's and she does not have behavioral issues  2. Mild episode of recurrent major depressive disorder (HCC) Recent dose increase in Zoloft Monitor for improvement  3. CHF (congestive heart failure), NYHA class I, chronic, diastolic (HCC) -NO SOB but hypoxia and crackles throughout noted with no SOB -check BNP, if normal her symptoms may be pulmonary  See below  4. Hypoxia Hx of pna vs mass on CT.  F/U CXRs have been normal but the area in question in August was only noted in the LLL on CT. Speech therapy has only labeled her with mild dysphagia with mild aspiration risk.  There are no reports of choking at this time but would keep this dx in the differential After discussion with Dr. Renato Gails, it was felt that given her advanced age, poor QOL, and debility it would not be prudent to continue with additional CT imaging. She would not be a candidate for aggressive measures.  Will check BNP to rule out CHF which would be something potentially treatable here at the facility, although her echo would not suggest this.     Continue oxygen as needed and nightly. Not able use IS.  5. Essential hypertension Controlled Continue hctz 12.5 mg qd  6. Oropharyngeal dysphagia Continue D 3 diet with thin liquid and asp prec      Family/ staff Communication: discussed with NSG supervisor  Labs/tests ordered:  CBC BMP BNP due to hypoxia

## 2016-12-11 LAB — CBC AND DIFFERENTIAL
HEMATOCRIT: 43 % (ref 36–46)
Hemoglobin: 14 g/dL (ref 12.0–16.0)
PLATELETS: 337 10*3/uL (ref 150–399)
WBC: 11 10*3/mL

## 2016-12-11 LAB — BASIC METABOLIC PANEL
BUN: 20 mg/dL (ref 4–21)
Creatinine: 0.8 mg/dL (ref 0.5–1.1)
GLUCOSE: 151 mg/dL
Potassium: 3.8 mmol/L (ref 3.4–5.3)
Sodium: 139 mmol/L (ref 137–147)

## 2016-12-16 ENCOUNTER — Non-Acute Institutional Stay (SKILLED_NURSING_FACILITY): Payer: Medicare Other | Admitting: Internal Medicine

## 2016-12-16 DIAGNOSIS — G309 Alzheimer's disease, unspecified: Secondary | ICD-10-CM | POA: Diagnosis not present

## 2016-12-16 DIAGNOSIS — I5032 Chronic diastolic (congestive) heart failure: Secondary | ICD-10-CM

## 2016-12-16 DIAGNOSIS — F05 Delirium due to known physiological condition: Secondary | ICD-10-CM

## 2016-12-16 DIAGNOSIS — J690 Pneumonitis due to inhalation of food and vomit: Secondary | ICD-10-CM | POA: Diagnosis not present

## 2016-12-16 DIAGNOSIS — R1312 Dysphagia, oropharyngeal phase: Secondary | ICD-10-CM | POA: Diagnosis not present

## 2016-12-16 DIAGNOSIS — F0280 Dementia in other diseases classified elsewhere without behavioral disturbance: Secondary | ICD-10-CM

## 2016-12-17 NOTE — Progress Notes (Signed)
[facility]   Place of Service:  SNF (31) Provider:  Tomiko Schoon L. Renato Gailseed, D.O., C.M.D.  Bufford SpikesEED, Olive Zmuda, DO  Patient Care Team: Kermit Baloiffany L Recardo Linn, DO as PCP - General (Geriatric Medicine) Melvenia NeedlesLeon Cashwell, MD as Consulting Physician (Ophthalmology) Nadara MustardMarcus V Duda, MD as Consulting Physician (Orthopedic Surgery) Donzetta Starchrew Jones, MD as Consulting Physician (Dermatology) Thana FarrLeslie Reynolds, MD as Consulting Physician (Neurology) Fletcher Anonhristina Wert, NP as Nurse Practitioner (Nurse Practitioner)  Extended Emergency Contact Information Primary Emergency Contact: [name] Address: [address]          [place], [place] [address] [place] Home Phone: [phone] Mobile Phone: [phone] Relation: Daughter  Code Status:  DNR Goals of care: Advanced Directive information Advanced Directives 12/11/2016  Does Patient Have a Medical Advance Directive? Yes  Type of Advance Directive Out of facility DNR (pink MOST or yellow form);Healthcare Power of Attorney  Does patient want to make changes to medical advance directive? -  Copy of Healthcare Power of Attorney in Chart? -  Pre-existing out of facility DNR order (yellow form or pink MOST form) Yellow form placed in chart (order not valid for inpatient use)     Chief Complaint  Patient presents with  . Acute Visit    hypoxia, cough, congestion, suspected aspiration, poor po intake, weight loss    HPI:  Pt is a 83 y.o. female seen today for an acute visit for hypoxia, cough, congestion, suspected aspiration, poor po intake and weight loss.    Mrs. Zettie CooleyLing has never returned to her baseline mental status after her hospitalization for pneumonia in Sept '17.  She has continued to have difficulty with intermittent spells of cough, congestion, hypoxia, poor po intake and weight loss, as well.  She has known dysphagia and GERD.  Review of records from august until now performed:   CXR done 8/24 showed central  pulmonary venous congestion w/o overt pulmonary edema, posterior chest density on oblique view that could be artifact (CT recommended).  KUB showed nonspecific nonobstructed bowel gas problem suggesting constipation.  She was sent out to the hospital due to persistent vomiting and concerns about having eaten a foreign body including some soap.  She was found to have pneumonia and treated with rocephin and zithromax for 5 days, sats returned to normal.  Synthroid and namenda were continued at d/c back here 8/28.  She had an echo during that stay showing EF normal with grade 1 diastolic dysfunction.  She returned on amoxicillin and zithromax.  There was no mention of it in d/c paperwork, but CT abd/pelvis incidentally noted "gas distended endometrial cavity and fluid in the upper vagina implying rectovaginal/uternine fistula" and "4 cm opacity in the subpleural left lower lobe that could be pneumonia or mass".  F/u xray in 3-4 wks recommended.  MBS there suggested an esophageal assessment.    CXR done 08/05/16 showed central pulmonary venous congestion w/o overt pulmonary edema and suggested referring to the prior report 8/24.    08/14/16, CXR done to eval mass and f/u pneumonia, showed no active disease and senescent findings.  It says no mass lesion noted by plain film.  Chronic linear interstitial prominence suspected to be chronic lung disease.  No significant pulmonary venous congestion.    10/15/16, CXR done for confusion and fever showed clear lungs w/o mass, consolidation or effusion, just the chronic linear interstitial prominence likely from chronic lung disease.    This most recent time, she's had episodes of vomiting and diarrhea, but  none x 48 hrs so isolation for norovirus d/cd.   She was having a wet cough, dypsnea with respiratory distress, hypoxia to 82% on 4 liters [place].  She was again quite confused and staff suspected she had aspirated during one of her recent vomiting episodes.  Rales and  rhonchi were heard throughout by RN.  When I saw her, she was no longer in distress.  She did have a wet cough and was confused, lethargic, and having difficulty focusing to answer questions.  Past Medical History:  Diagnosis Date  . Abnormality of gait and mobility   . Depression   . Gastroesophageal reflux disease without esophagitis   . Hereditary and idiopathic neuropathy   . Hypothyroidism   . Irregular sleep-wake rhythm, nonorganic origin   . Osteoarthritis   . Osteoporosis   . Vertigo   . Vitamin B 12 deficiency    Past Surgical History:  Procedure Laterality Date  . ALLOGRAFT APPLICATION  2008  . CHOLECYSTECTOMY    . COSMETIC SURGERY    . ORIF NONUNION HUMERUS FRACTURE Left    OP1  . t12 vertebroplasty      Allergies  Allergen Reactions  . Codeine   . Morphine And Related     Allergies as of 12/16/2016      Reactions   Codeine    Morphine And Related       Medication List       Accurate as of 12/16/16 11:59 PM. Always use your most recent med list.          acetaminophen 325 MG tablet Commonly known as:  TYLENOL Take 650 mg by mouth every 6 (six) hours as needed (pain.).   ALIGN 4 MG Caps Take 4 mg by mouth. Take one each morning   aspirin 81 MG chewable tablet Chew by mouth daily.   Calcium Carbonate-Vitamin D 600-400 MG-UNIT tablet Take 1 tablet by mouth daily.   cefUROXime 250 MG tablet Commonly known as:  CEFTIN Take 250 mg by mouth daily.   cetirizine 5 MG tablet Commonly known as:  ZYRTEC Take 5 mg by mouth every evening.   donepezil 10 MG tablet Commonly known as:  ARICEPT Take 10 mg by mouth at bedtime. For memory   fluticasone 50 MCG/ACT nasal spray Commonly known as:  FLONASE Place 1 spray into both nostrils. Twice daily   food thickener Powd Commonly known as:  THICK IT Take by mouth as needed (one week trial of nectar-thickened liquids until 07/28/16. No straws.).   GERI-LANTA 200-200-20 MG/5ML suspension Generic drug:   alum & mag hydroxide-simeth Take 15 mLs by mouth every 4 (four) hours as needed for indigestion or heartburn.   hydrochlorothiazide 12.5 MG capsule Commonly known as:  MICROZIDE Take 12.5 mg by mouth daily.   levothyroxine 88 MCG tablet Commonly known as:  SYNTHROID, LEVOTHROID Take 88 mcg by mouth daily before breakfast.   memantine 10 MG tablet Commonly known as:  NAMENDA Take 10 mg by mouth 2 (two) times daily.   polyethylene glycol packet Commonly known as:  MIRALAX / GLYCOLAX Take 17 g by mouth daily.   pramipexole 0.25 MG tablet Commonly known as:  MIRAPEX Take 0.25 mg by mouth. Take one tablet at bedtime   sertraline 50 MG tablet Commonly known as:  ZOLOFT Take 50 mg by mouth daily.   vitamin B-12 1000 MCG tablet Commonly known as:  CYANOCOBALAMIN Take 1,000 mcg by mouth daily.   Vitamin D3 2000 units Tabs Take by  mouth. Take one each morning       Review of Systems  Constitutional: Positive for malaise/fatigue. Negative for chills and fever.  HENT: Negative for congestion.   Respiratory: Positive for cough and shortness of breath. Negative for sputum production and wheezing.   Cardiovascular: Negative for chest pain, palpitations and leg swelling.  Gastrointestinal: Positive for heartburn. Negative for abdominal pain, blood in stool, constipation, diarrhea and melena.  Genitourinary: Negative for dysuria.  Musculoskeletal: Negative for myalgias.  Skin: Negative for rash.  Neurological: Positive for weakness. Negative for dizziness, loss of consciousness and headaches.  Psychiatric/Behavioral: Positive for memory loss.    Immunization History  Administered Date(s) Administered  . Influenza Inj Mdck Quad Pf 09/18/2016  . Influenza-Unspecified 09/13/2015  . PPD Test 05/25/2011, 10/14/2011  . Pneumococcal Conjugate-13 08/01/2014  . Pneumococcal Polysaccharide-23 11/27/2011  . Zoster 07/21/2012   Pertinent  Health Maintenance Due  Topic Date Due  . DEXA  SCAN  08/07/1988  . INFLUENZA VACCINE  Completed  . PNA vac Low Risk Adult  Completed   Fall Risk  03/07/2015  Falls in the past year? Yes  Number falls in past yr: 1   Functional Status Survey:    There were no vitals filed for this visit. There is no height or weight on file to calculate BMI. Physical Exam  Constitutional: She appears well-developed and well-nourished. No distress.  Cardiovascular: Normal rate, regular rhythm, normal heart sounds and intact distal pulses.   No peripheral or sacral edema  Pulmonary/Chest: Effort normal. No respiratory distress.  Coarse wet rhonchi throughout bilateral lung fields anteriorly and posteriorly  Abdominal: Soft. Bowel sounds are normal. She exhibits no distension. There is no tenderness.  Musculoskeletal: Normal range of motion.  Neurological:  Able to be aroused, but falls back to sleep in short order, not able to focus to answer questions  Skin: Skin is warm and dry. There is pallor.    Labs reviewed:  Recent Labs  07/24/16 1638 07/26/16 1127 08/14/16 10/16/16  NA 143 138 143 142  K 3.5 3.8 3.6 4.1  CL 100* 101  --   --   CO2 35* 30  --   --   GLUCOSE 114* 117*  --   --   BUN 18 12 21  26*  CREATININE 1.00 0.71 1.0 1.1  CALCIUM 10.1 8.8*  --   --     Recent Labs  07/24/16 1638  AST 21  ALT 20  ALKPHOS 93  BILITOT 0.5  PROT 7.7  ALBUMIN 3.8    Recent Labs  07/24/16 1638 07/26/16 1127 08/14/16 10/16/16  WBC 9.6 8.5 9.9 10.7  NEUTROABS 6.4  --   --   --   HGB 14.6 12.7 14.8 14.0  HCT 46.3* 41.6 45 45  MCV 98.7 101.7*  --   --   PLT 374 280 377 305   Lab Results  Component Value Date   TSH 2.50 08/15/2016   No results found for: HGBA1C No results found for: CHOL, HDL, LDLCALC, LDLDIRECT, TRIG, CHOLHDL  Significant Diagnostic Results in last 30 days:  See hpi for extensive test review  Assessment/Plan 1. Aspiration pneumonia due to vomit, unspecified laterality, unspecified part of lung (HCC) -CXR  ordered and pending, augmentin x 10 days, xopenex nebs q 6hrs while awake, push po fluids, titrate O2 to keep sats over 88% -d/c hctz due to potential to worsen dehydration with poor po intake (also not helpful in chf anyway)  2. CHF (congestive heart  failure), NYHA class I, chronic, diastolic (HCC) -does not have overt signs of chf w/o rales on my exam, jvd, peripheral or sacral edema  3. Alzheimer's dementia without behavioral disturbance, unspecified timing of dementia onset -is advanced, lives in snf care -awaiting call back from family to determine goals--managing in house at this point but need to complete MOST form  4. Oropharyngeal dysphagia -ongoing, likely caused another episode of aspiration when she was vomiting due to virus -cont aspiration precautions  5. Acute hypoactive delirium due to another medical condition -due to pneumonia, baseline dementia, hypoxia, recent viral illness  Family/ staff Communication: discussed with RN, DON, SNF nurse and they had reached out to pt's daughter, [name], and son to get some more information about goals of care, but we had not gotten a return call by end of day  Labs/tests ordered:  CXR  Maham Quintin L. Tajee Savant, D.O. Geriatrics [facility] [address] [place], [place] [address] Cell Phone (Mon-Fri 8am-5pm):  [phone] On Call:  [phone] & follow prompts after 5pm & weekends Office Phone:  [phone] Office Fax:  [phone]

## 2016-12-20 ENCOUNTER — Encounter: Payer: Self-pay | Admitting: Internal Medicine

## 2016-12-20 NOTE — Progress Notes (Signed)
proBNP 280.50 1/11/8

## 2016-12-23 ENCOUNTER — Encounter: Payer: Self-pay | Admitting: Internal Medicine

## 2016-12-23 ENCOUNTER — Non-Acute Institutional Stay (SKILLED_NURSING_FACILITY): Payer: Medicare Other | Admitting: Internal Medicine

## 2016-12-23 DIAGNOSIS — Z7189 Other specified counseling: Secondary | ICD-10-CM | POA: Diagnosis not present

## 2016-12-23 DIAGNOSIS — I5032 Chronic diastolic (congestive) heart failure: Secondary | ICD-10-CM | POA: Diagnosis not present

## 2016-12-23 DIAGNOSIS — F0280 Dementia in other diseases classified elsewhere without behavioral disturbance: Secondary | ICD-10-CM | POA: Diagnosis not present

## 2016-12-23 DIAGNOSIS — G309 Alzheimer's disease, unspecified: Secondary | ICD-10-CM | POA: Diagnosis not present

## 2016-12-23 DIAGNOSIS — R1312 Dysphagia, oropharyngeal phase: Secondary | ICD-10-CM | POA: Diagnosis not present

## 2016-12-23 DIAGNOSIS — J690 Pneumonitis due to inhalation of food and vomit: Secondary | ICD-10-CM | POA: Diagnosis not present

## 2016-12-23 NOTE — Progress Notes (Signed)
Patient ID: Ellen Hughes, female   DOB: [birth year], 83 y.o.   MRN: [id number]  Location:  [place] Room Number: 134 Place of Service:  SNF (31) Provider:  Rolla Kedzierski L. Mariea Clonts, D.O., C.M.D.  Hollace Kinnier, DO  Patient Care Team: Gayland Curry, DO as PCP - General (Geriatric Medicine) Prentiss Bells, MD as Consulting Physician (Ophthalmology) Newt Minion, MD as Consulting Physician (Orthopedic Surgery) Jarome Matin, MD as Consulting Physician (Dermatology) Alexis Goodell, MD as Consulting Physician (Neurology) Royal Hawthorn, NP as Nurse Practitioner (Nurse Practitioner)  Extended Emergency Contact Information Primary Emergency Contact: [facility] Address: [place], [place] [address] [place]te [place] of [place] Phone: [phone] Mobile Phone: [phone] Relation: Daughter  Code Status:  DNR; MOST form completed today Goals of care: Advanced Directive information Advanced Directives 12/23/2016  Does Patient Have a Medical Advance Directive? Yes  Type of Advance Directive Out of facility DNR (pink MOST or yellow form);Healthcare Power of Attorney  Does patient want to make changes to medical advance directive? No - Patient declined  Copy of [place] in Chart? Yes  Pre-existing out of facility DNR order (yellow form or pink MOST form) Yellow form placed in chart (order not valid for inpatient use)   Chief Complaint  Patient presents with  . Acute Visit    respiratory symptoms    HPI:  Pt is a 83 y.o. female seen today for an acute visit for ongoing respiratory symptoms and to meet with her family to answer questions.  I met with his daughter, Ellen Hughes, son, and youngest daughter in the room with Ellen Hughes.  We discussed the MOST form.  The DON had already had several discussions with her family about goals of care.  Several life prolonging medications have been discontinued with the goal of comfort.   They were asking about her EF which was 55-60% on her echo in august, but she had grade 1 diastolic dysfunction.  She continues to have difficulty maintaining her O2 saturation even with 4L O2 via [place].  She is resting comfortably, however, and resp rate wnl at the time I laid eyes on her.  We also reviewed her MBS from 8/25 which had resulted in a recommendation for dysphagia 3 mechanical soft diet with thin liquids and whole meds with liquid.  It is still suspected that she aspirated and may also have some chf at this point.     Past Medical History:  Diagnosis Date  . Abnormality of gait and mobility   . Depression   . Gastroesophageal reflux disease without esophagitis   . Hereditary and idiopathic neuropathy   . Hypothyroidism   . Irregular sleep-wake rhythm, nonorganic origin   . Osteoarthritis   . Osteoporosis   . Vertigo   . Vitamin B 12 deficiency    Past Surgical History:  Procedure Laterality Date  . ALLOGRAFT APPLICATION  3545  . CHOLECYSTECTOMY    . COSMETIC SURGERY    . ORIF NONUNION HUMERUS FRACTURE Left    OP1  . t12 vertebroplasty      Allergies  Allergen Reactions  . Codeine   . Morphine And Related     Allergies as of 12/23/2016      Reactions   Codeine    Morphine And Related       Medication List       Accurate as of 12/23/16 11:59 PM. Always use your most  recent med list.          acetaminophen 325 MG tablet Commonly known as:  TYLENOL Take 650 mg by mouth every 6 (six) hours as needed (pain.).   ALIGN 4 MG Caps Take 4 mg by mouth. Take one each morning   amoxicillin-clavulanate 875-125 MG tablet Commonly known as:  AUGMENTIN Take 1 tablet by mouth 2 (two) times daily.   cefUROXime 250 MG tablet Commonly known as:  CEFTIN HOLD WHILE ON AUGMENTIN   fluticasone 50 MCG/ACT nasal spray Commonly known as:  FLONASE Place 1 spray into both nostrils. Twice daily   GERI-LANTA 200-200-20 MG/5ML suspension Generic drug:  alum & mag  hydroxide-simeth Take 15 mLs by mouth every 4 (four) hours as needed for indigestion or heartburn.   levalbuterol 0.63 MG/3ML nebulizer solution Commonly known as:  XOPENEX Take 0.63 mg by nebulization every 6 (six) hours as needed for wheezing or shortness of breath.   levothyroxine 88 MCG tablet Commonly known as:  SYNTHROID, LEVOTHROID Take 88 mcg by mouth daily before breakfast.   methocarbamol 500 MG tablet Commonly known as:  ROBAXIN Take 500 mg by mouth every 6 (six) hours as needed for muscle spasms.   morphine 20 MG/5ML solution Take 5 mg by mouth every 4 (four) hours as needed for pain (SOB).   ondansetron 4 MG tablet Commonly known as:  ZOFRAN Take 4 mg by mouth every 4 (four) hours as needed for nausea.   polyethylene glycol packet Commonly known as:  MIRALAX / GLYCOLAX Take 17 g by mouth daily.   pramipexole 0.25 MG tablet Commonly known as:  MIRAPEX Take 0.25 mg by mouth. Take one tablet at bedtime   sertraline 50 MG tablet Commonly known as:  ZOLOFT Take 50 mg by mouth daily.       Review of Systems  Constitutional: Positive for activity change, appetite change and fatigue. Negative for chills and fever.  HENT: Positive for congestion.   Respiratory: Positive for cough and shortness of breath. Negative for apnea, chest tightness and stridor.   Cardiovascular: Negative for chest pain, palpitations and leg swelling.  Gastrointestinal: Negative for abdominal pain, constipation, diarrhea, nausea and vomiting.  Genitourinary:       Incontinence  Musculoskeletal: Positive for gait problem. Negative for arthralgias, back pain and myalgias.  Skin: Negative for color change.  Neurological: Positive for weakness.  Psychiatric/Behavioral: Positive for confusion. Negative for suicidal ideas.    Immunization History  Administered Date(s) Administered  . Influenza Inj Mdck Quad Pf 09/18/2016  . Influenza-Unspecified 09/13/2015  . PPD Test 05/25/2011, 10/14/2011   . Pneumococcal Conjugate-13 08/01/2014  . Pneumococcal Polysaccharide-23 11/27/2011  . Zoster 07/21/2012   There are no preventive care reminders to display for this patient. Fall Risk  03/07/2015  Falls in the past year? Yes  Number falls in past yr: 1   Functional Status Survey:    Vitals:   12/23/16 1039  BP: 120/75  Pulse: 94  Resp: (!) 22  Temp: 97.5 F (36.4 C)  TempSrc: Oral  SpO2: (!) 78%   There is no height or weight on file to calculate BMI. Physical Exam  Constitutional: She appears well-developed. No distress.  Sitting up in recliner wearing oxygen, appears comfortable, is able to speak some though speech is not fluent and thoughts not always complete (not new)  Neck: No JVD present.  Cardiovascular: Normal rate and regular rhythm.   Pulmonary/Chest: Effort normal. She has rales.  Coarse rales throughout both lung fields  Abdominal: Soft. Bowel sounds are normal.  Neurological: She is alert.  Skin: Skin is warm and dry.  Psychiatric: She has a normal mood and affect.    Labs reviewed:  Recent Labs  07/24/16 1638 07/26/16 1127 08/14/16 10/16/16 12/11/16  NA 143 138 143 142 139  K 3.5 3.8 3.6 4.1 3.8  CL 100* 101  --   --   --   CO2 35* 30  --   --   --   GLUCOSE 114* 117*  --   --   --   BUN _0 26* 20  CREATININE 1.00 0.71 1.0 1.1 0.8  CALCIUM 10.1 8.8*  --   --   --     Recent Labs  07/24/16 1638  AST 21  ALT 20  ALKPHOS 93  BILITOT 0.5  PROT 7.7  ALBUMIN 3.8    Recent Labs  07/24/16 1638 07/26/16 1127 08/14/16 10/16/16 12/11/16  WBC 9.6 8.5 9.9 10.7 11.0  NEUTROABS 6.4  --   --   --   --   HGB 14.6 12.7 14.8 14.0 14.0  HCT 46.3* 41.6 45 45 43  MCV 98.7 101.7*  --   --   --   PLT 374 280 377 305 337   Lab Results  Component Value Date   TSH 2.50 08/15/2016   Assessment/Plan 1. Aspiration pneumonia due to vomit, unspecified laterality, unspecified part of lung ([place]) -family has opted to stop augmentin due to  life-prolonging purpose and lack of clear benefit at this point in improving her respiratory condition -we have stopped all oral meds besides comfort medications -cont ativan and roxanol for comfort  2. CHF (congestive heart failure), NYHA class I, chronic, diastolic (HCC) -lasix 26VZ once due to rales (may actually be early "death rattle" secretions, but pt notably alert and conversive for this to be the case) -scopolamine patch for secretions  3. Oropharyngeal dysphagia -cause of #1, modified diet with aspiration precautions but still aspirated   4. Alzheimer's dementia without behavioral disturbance, unspecified timing of dementia onset -advanced stage, MOST completed today to match with wishes discussed over the past several days from family (and pt has hcpoa and living will documents, has been DNR)  5.  Advance care planning: -MOST done for DNR, comfort measures, do not hospitalize, no abx, no fluids, no feeding tube  Family/ staff Communication: approximately 30 mins were spent with pt and family reviewing goals of care and an additional 25 on reviewing medical information, exam, labs/studies/coordination of care  Labs/tests ordered:  No new due to goals of care

## 2017-01-01 DEATH — deceased

## 2017-12-31 IMAGING — RF DG SWALLOWING FUNCTION - NRPT [facility]
13 series · 20 of 24 positions shown · non-contrast
Comparison: none

[Series 1: cp_standard · 0.35mm/px · 2 of 13 frames shown (1 of 13)]
[frame 2/13]
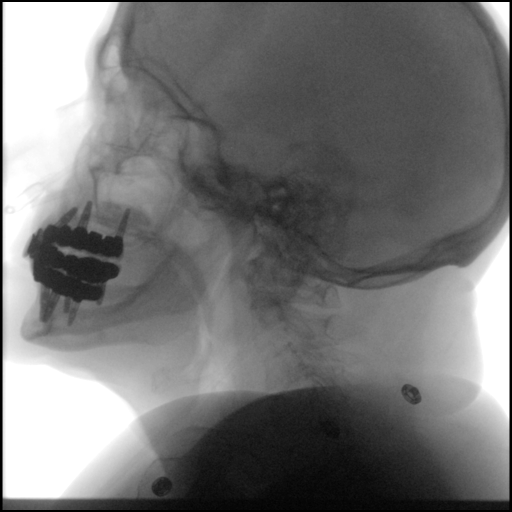
[frame 12/13]
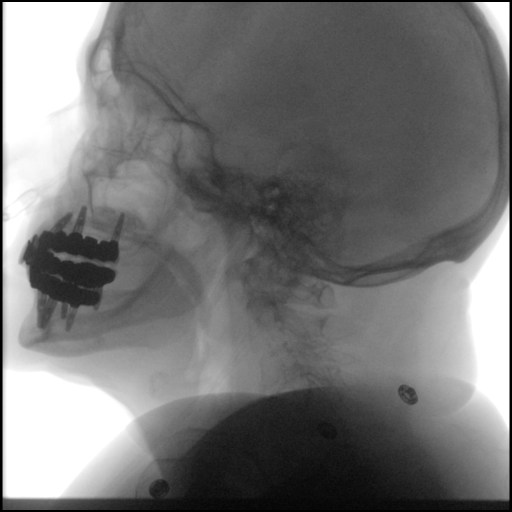

[Series 2: cp_standard · 0.35mm/px · 1 of 30 frames shown (2 of 13)]
[frame 30/30]
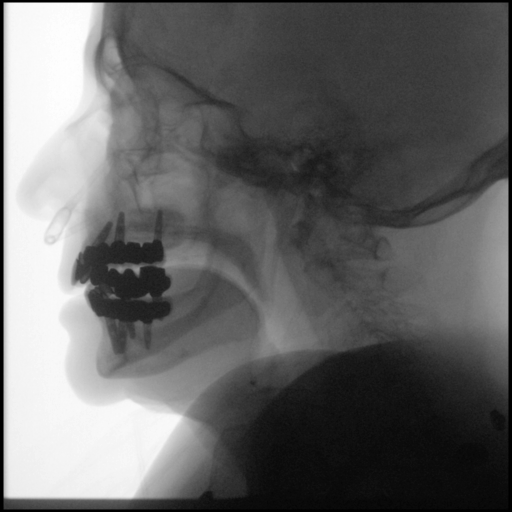

[Series 3: cp_standard · 0.35mm/px · 2 of 43 frames shown (3 of 13)]
[frame 21/43]
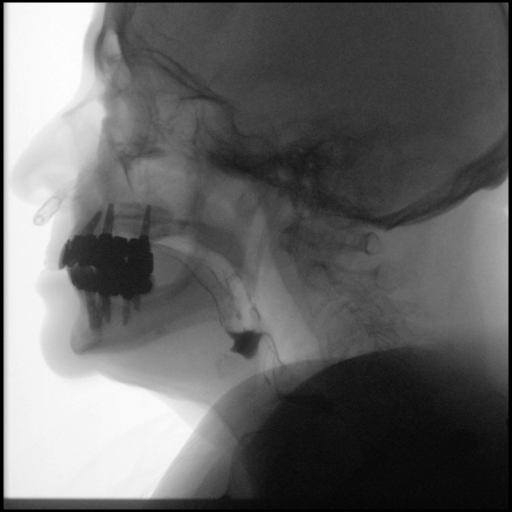
[frame 37/43]
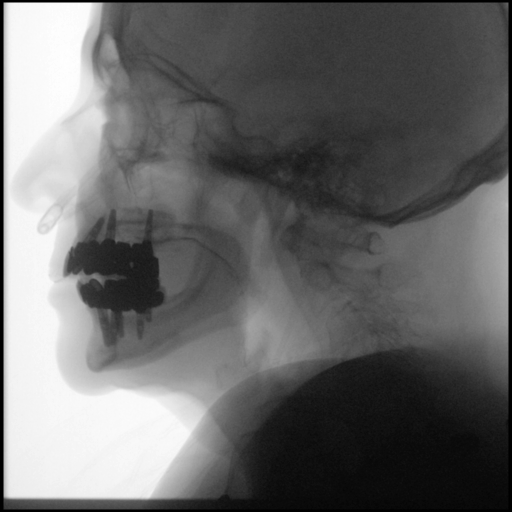

[Series 4: cp_standard · 0.35mm/px · 1 of 48 frames shown (4 of 13)]
[frame 25/48]
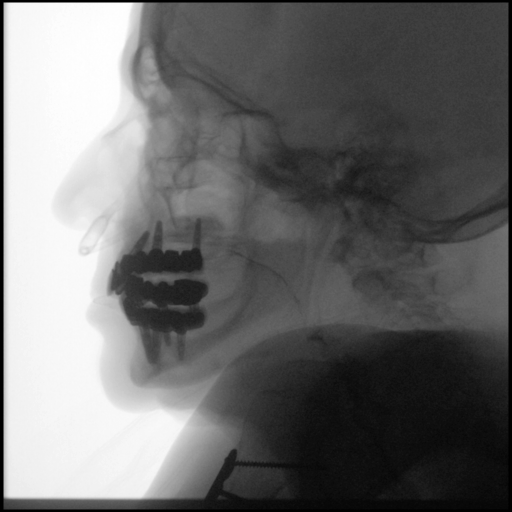

[Series 5: cp_standard · 0.35mm/px · 1 of 38 frames shown (5 of 13)]
[frame 6/38]
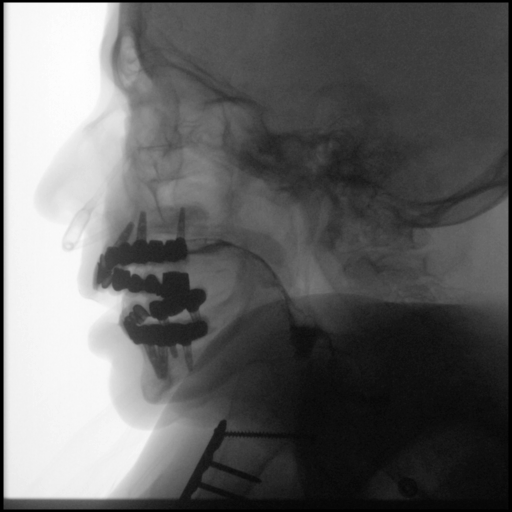

[Series 6: cp_standard · 0.35mm/px · 2 of 84 frames shown (6 of 13)]
[frame 9/84]
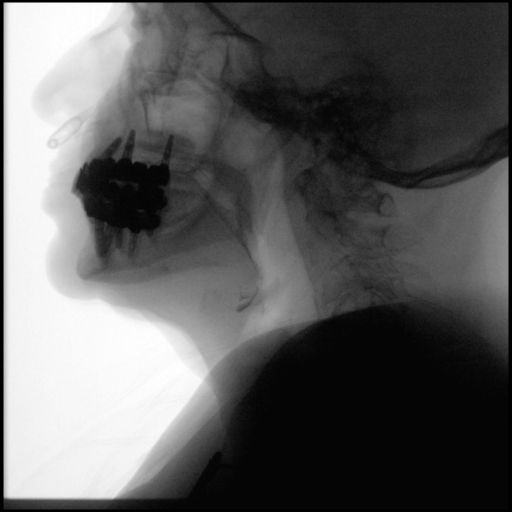
[frame 43/84]
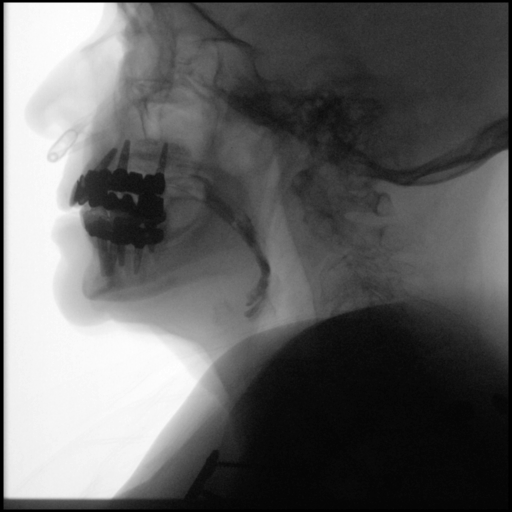

[Series 7: cp_standard · 0.35mm/px · 2 of 206 frames shown (7 of 13)]
[frame 31/206]
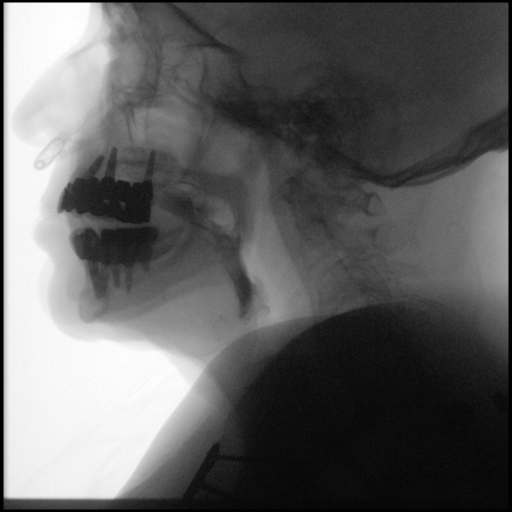
[frame 205/206]
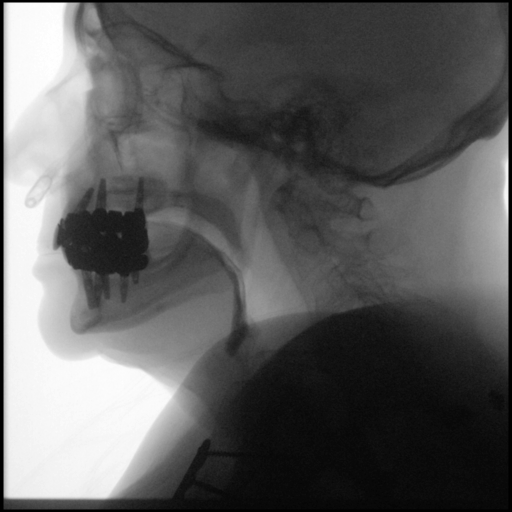

[Series 8: cp_standard · 0.35mm/px · 1 of 33 frames shown (8 of 13)]
[frame 5/33]
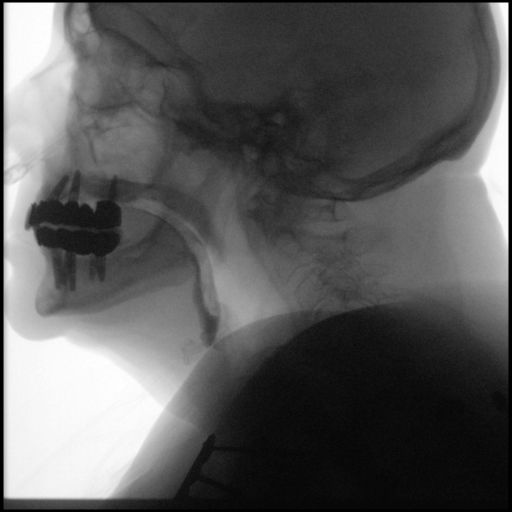

[Series 9: cp_standard · 0.35mm/px · 2 of 18 frames shown (9 of 13)]
[frame 10/18]
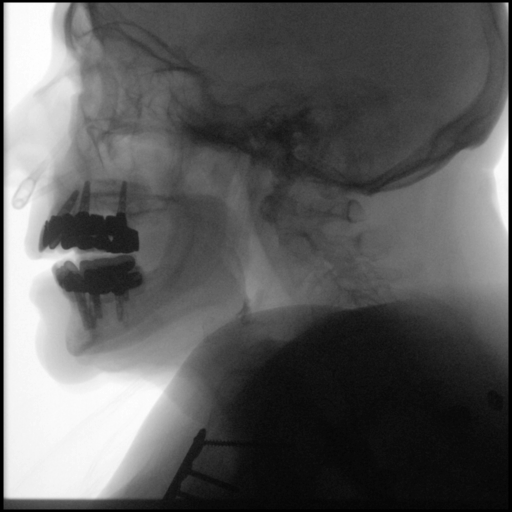
[frame 16/18]
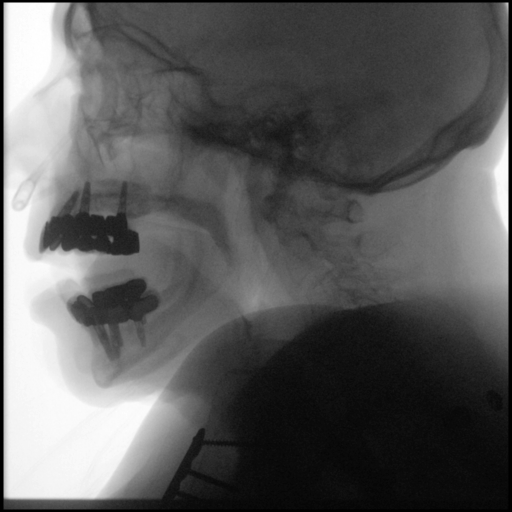

[Series 10: cp_standard · 0.35mm/px · 1 of 184 frames shown (10 of 13)]
[frame 93/184]
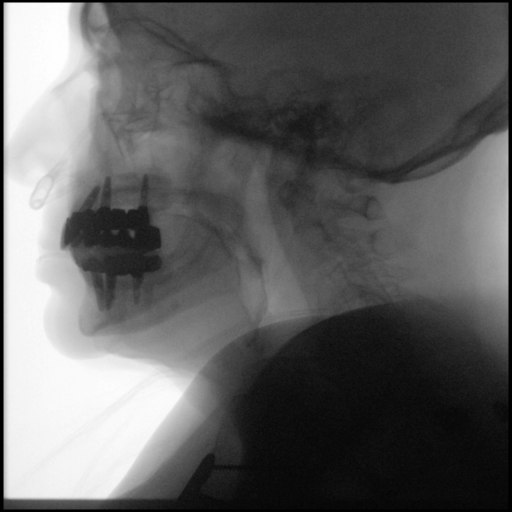

[Series 11: cp_standard · 0.34mm/px · 2 of 119 frames shown (11 of 13)]
[frame 2/119]
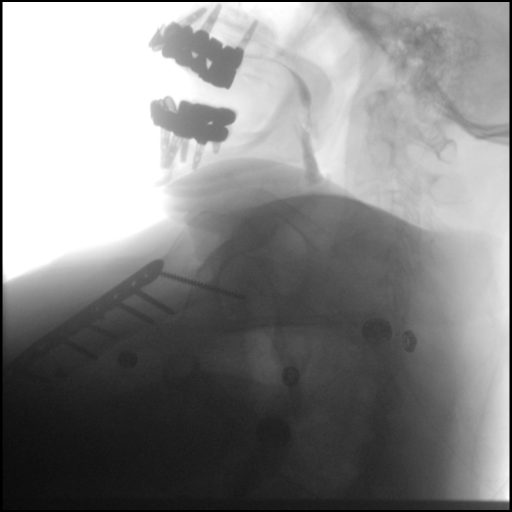
[frame 60/119]
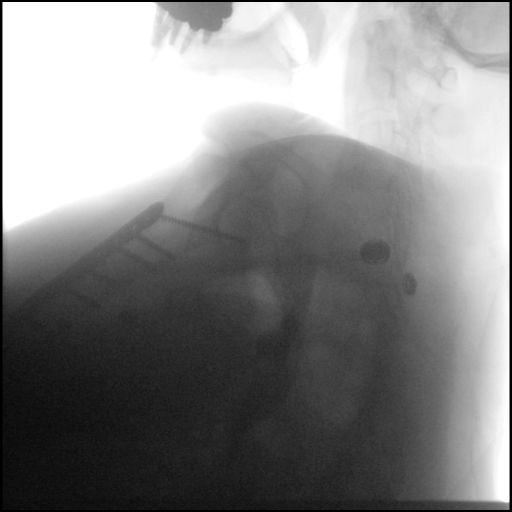

[Series 12: cp_standard · 0.35mm/px · 1 of 36 frames shown (12 of 13)]
[frame 34/36]
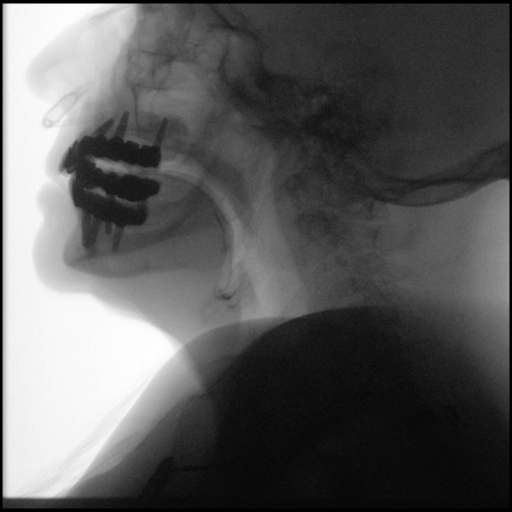

[Series 13: cp_standard · 0.35mm/px · 2 of 93 frames shown (13 of 13)]
[frame 47/93]
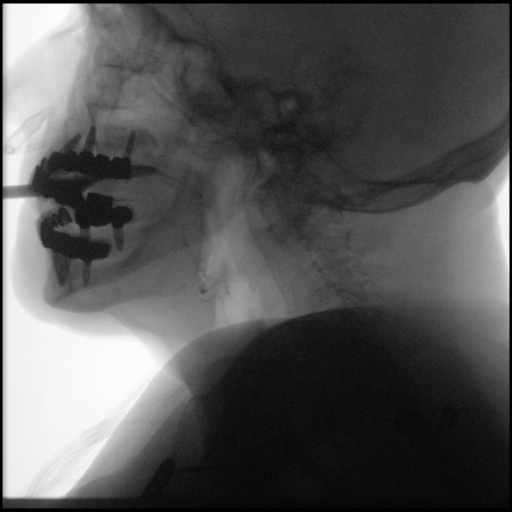
[frame 80/93]
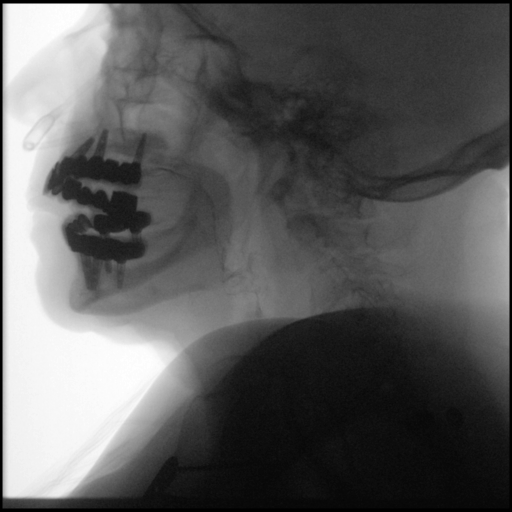

[20 of 24 positions shown; findings below may reference images not displayed]

FLUOROSCOPY FOR SWALLOWING FUNCTION STUDY:
Fluoroscopy was provided for swallowing function study, which was administered by a speech pathologist.  Final results and recommendations from this study are contained within the speech pathology report.
